# Patient Record
Sex: Male | Born: 1945 | Race: Black or African American | Hispanic: No | Marital: Married | Smoking: Former smoker
Health system: Southern US, Community
[De-identification: ages and names within clinical notes are randomized; demographics above are authoritative.]

## PROBLEM LIST (undated history)

## (undated) DIAGNOSIS — E785 Hyperlipidemia, unspecified: Secondary | ICD-10-CM

## (undated) DIAGNOSIS — K759 Inflammatory liver disease, unspecified: Secondary | ICD-10-CM

## (undated) DIAGNOSIS — I10 Essential (primary) hypertension: Secondary | ICD-10-CM

## (undated) DIAGNOSIS — M1990 Unspecified osteoarthritis, unspecified site: Secondary | ICD-10-CM

## (undated) DIAGNOSIS — R519 Headache, unspecified: Secondary | ICD-10-CM

## (undated) HISTORY — PX: OTHER SURGICAL HISTORY: SHX169

---

## 2015-09-20 HISTORY — PX: MULTIPLE TOOTH EXTRACTIONS: SHX2053

## 2018-05-22 DIAGNOSIS — E785 Hyperlipidemia, unspecified: Secondary | ICD-10-CM | POA: Diagnosis not present

## 2018-05-22 DIAGNOSIS — I10 Essential (primary) hypertension: Secondary | ICD-10-CM | POA: Diagnosis not present

## 2018-06-21 DIAGNOSIS — E559 Vitamin D deficiency, unspecified: Secondary | ICD-10-CM | POA: Diagnosis not present

## 2018-06-21 DIAGNOSIS — I10 Essential (primary) hypertension: Secondary | ICD-10-CM | POA: Diagnosis not present

## 2018-09-18 DIAGNOSIS — J069 Acute upper respiratory infection, unspecified: Secondary | ICD-10-CM | POA: Diagnosis not present

## 2018-12-06 DIAGNOSIS — I10 Essential (primary) hypertension: Secondary | ICD-10-CM | POA: Diagnosis not present

## 2018-12-06 DIAGNOSIS — E785 Hyperlipidemia, unspecified: Secondary | ICD-10-CM | POA: Diagnosis not present

## 2019-05-14 DIAGNOSIS — E349 Endocrine disorder, unspecified: Secondary | ICD-10-CM | POA: Diagnosis not present

## 2019-05-14 DIAGNOSIS — R972 Elevated prostate specific antigen [PSA]: Secondary | ICD-10-CM | POA: Diagnosis not present

## 2019-05-14 DIAGNOSIS — N5201 Erectile dysfunction due to arterial insufficiency: Secondary | ICD-10-CM | POA: Diagnosis not present

## 2019-06-03 DIAGNOSIS — N5201 Erectile dysfunction due to arterial insufficiency: Secondary | ICD-10-CM | POA: Diagnosis not present

## 2019-06-12 DIAGNOSIS — E559 Vitamin D deficiency, unspecified: Secondary | ICD-10-CM | POA: Diagnosis not present

## 2019-06-12 DIAGNOSIS — I10 Essential (primary) hypertension: Secondary | ICD-10-CM | POA: Diagnosis not present

## 2019-06-12 DIAGNOSIS — E785 Hyperlipidemia, unspecified: Secondary | ICD-10-CM | POA: Diagnosis not present

## 2019-08-20 DIAGNOSIS — I10 Essential (primary) hypertension: Secondary | ICD-10-CM | POA: Diagnosis not present

## 2019-08-20 DIAGNOSIS — Z136 Encounter for screening for cardiovascular disorders: Secondary | ICD-10-CM | POA: Diagnosis not present

## 2019-08-20 DIAGNOSIS — Z23 Encounter for immunization: Secondary | ICD-10-CM | POA: Diagnosis not present

## 2019-08-20 DIAGNOSIS — Z1211 Encounter for screening for malignant neoplasm of colon: Secondary | ICD-10-CM | POA: Diagnosis not present

## 2019-08-20 DIAGNOSIS — Z0000 Encounter for general adult medical examination without abnormal findings: Secondary | ICD-10-CM | POA: Diagnosis not present

## 2019-08-20 DIAGNOSIS — Z125 Encounter for screening for malignant neoplasm of prostate: Secondary | ICD-10-CM | POA: Diagnosis not present

## 2019-08-20 DIAGNOSIS — E7800 Pure hypercholesterolemia, unspecified: Secondary | ICD-10-CM | POA: Diagnosis not present

## 2019-10-15 DIAGNOSIS — R972 Elevated prostate specific antigen [PSA]: Secondary | ICD-10-CM | POA: Diagnosis not present

## 2019-10-15 DIAGNOSIS — N5201 Erectile dysfunction due to arterial insufficiency: Secondary | ICD-10-CM | POA: Diagnosis not present

## 2020-01-07 DIAGNOSIS — Z1211 Encounter for screening for malignant neoplasm of colon: Secondary | ICD-10-CM | POA: Diagnosis not present

## 2020-04-07 DIAGNOSIS — G629 Polyneuropathy, unspecified: Secondary | ICD-10-CM | POA: Diagnosis not present

## 2020-04-07 DIAGNOSIS — E559 Vitamin D deficiency, unspecified: Secondary | ICD-10-CM | POA: Diagnosis not present

## 2020-04-07 DIAGNOSIS — I10 Essential (primary) hypertension: Secondary | ICD-10-CM | POA: Diagnosis not present

## 2020-04-07 DIAGNOSIS — R972 Elevated prostate specific antigen [PSA]: Secondary | ICD-10-CM | POA: Diagnosis not present

## 2020-04-07 DIAGNOSIS — E785 Hyperlipidemia, unspecified: Secondary | ICD-10-CM | POA: Diagnosis not present

## 2020-04-08 DIAGNOSIS — R7309 Other abnormal glucose: Secondary | ICD-10-CM | POA: Diagnosis not present

## 2020-04-13 DIAGNOSIS — E7800 Pure hypercholesterolemia, unspecified: Secondary | ICD-10-CM | POA: Diagnosis not present

## 2020-04-13 DIAGNOSIS — I10 Essential (primary) hypertension: Secondary | ICD-10-CM | POA: Diagnosis not present

## 2020-04-13 DIAGNOSIS — E785 Hyperlipidemia, unspecified: Secondary | ICD-10-CM | POA: Diagnosis not present

## 2020-04-15 ENCOUNTER — Emergency Department (HOSPITAL_COMMUNITY): Payer: Medicare HMO

## 2020-04-15 ENCOUNTER — Encounter (HOSPITAL_COMMUNITY): Payer: Self-pay | Admitting: Pediatrics

## 2020-04-15 ENCOUNTER — Other Ambulatory Visit: Payer: Self-pay

## 2020-04-15 ENCOUNTER — Emergency Department (HOSPITAL_COMMUNITY)
Admission: EM | Admit: 2020-04-15 | Discharge: 2020-04-16 | Disposition: A | Discharge status: Home / Self Care | Payer: Medicare HMO | Attending: Emergency Medicine | Admitting: Emergency Medicine

## 2020-04-15 DIAGNOSIS — S82891A Other fracture of right lower leg, initial encounter for closed fracture: Secondary | ICD-10-CM

## 2020-04-15 DIAGNOSIS — M25571 Pain in right ankle and joints of right foot: Secondary | ICD-10-CM | POA: Diagnosis not present

## 2020-04-15 DIAGNOSIS — X30XXXA Exposure to excessive natural heat, initial encounter: Secondary | ICD-10-CM | POA: Diagnosis not present

## 2020-04-15 DIAGNOSIS — S82831A Other fracture of upper and lower end of right fibula, initial encounter for closed fracture: Secondary | ICD-10-CM | POA: Diagnosis not present

## 2020-04-15 DIAGNOSIS — Y939 Activity, unspecified: Secondary | ICD-10-CM | POA: Insufficient documentation

## 2020-04-15 DIAGNOSIS — R52 Pain, unspecified: Secondary | ICD-10-CM | POA: Diagnosis not present

## 2020-04-15 DIAGNOSIS — Y999 Unspecified external cause status: Secondary | ICD-10-CM | POA: Diagnosis not present

## 2020-04-15 DIAGNOSIS — Y929 Unspecified place or not applicable: Secondary | ICD-10-CM | POA: Diagnosis not present

## 2020-04-15 DIAGNOSIS — S99911A Unspecified injury of right ankle, initial encounter: Secondary | ICD-10-CM | POA: Diagnosis present

## 2020-04-15 DIAGNOSIS — R609 Edema, unspecified: Secondary | ICD-10-CM | POA: Diagnosis not present

## 2020-04-15 DIAGNOSIS — R55 Syncope and collapse: Secondary | ICD-10-CM | POA: Insufficient documentation

## 2020-04-15 DIAGNOSIS — R600 Localized edema: Secondary | ICD-10-CM | POA: Diagnosis not present

## 2020-04-15 DIAGNOSIS — J341 Cyst and mucocele of nose and nasal sinus: Secondary | ICD-10-CM | POA: Diagnosis not present

## 2020-04-15 DIAGNOSIS — T675XXA Heat exhaustion, unspecified, initial encounter: Secondary | ICD-10-CM

## 2020-04-15 DIAGNOSIS — S0990XA Unspecified injury of head, initial encounter: Secondary | ICD-10-CM | POA: Diagnosis not present

## 2020-04-15 DIAGNOSIS — R4020 Unspecified coma: Secondary | ICD-10-CM | POA: Diagnosis not present

## 2020-04-15 DIAGNOSIS — R42 Dizziness and giddiness: Secondary | ICD-10-CM | POA: Diagnosis not present

## 2020-04-15 HISTORY — DX: Other fracture of right lower leg, initial encounter for closed fracture: S82.891A

## 2020-04-15 LAB — BASIC METABOLIC PANEL
Anion gap: 9 (ref 5–15)
BUN: 10 mg/dL (ref 8–23)
CO2: 30 mmol/L (ref 22–32)
Calcium: 9.6 mg/dL (ref 8.9–10.3)
Chloride: 103 mmol/L (ref 98–111)
Creatinine, Ser: 1.46 mg/dL — ABNORMAL HIGH (ref 0.61–1.24)
GFR calc Af Amer: 54 mL/min — ABNORMAL LOW (ref >60)
GFR calc non Af Amer: 46 mL/min — ABNORMAL LOW (ref >60)
Glucose, Bld: 162 mg/dL — ABNORMAL HIGH (ref 70–99)
Potassium: 4.1 mmol/L (ref 3.5–5.1)
Sodium: 142 mmol/L (ref 135–145)

## 2020-04-15 LAB — CBC
HCT: 44.7 % (ref 39.0–52.0)
Hemoglobin: 14.4 g/dL (ref 13.0–17.0)
MCH: 30.5 pg (ref 26.0–34.0)
MCHC: 32.2 g/dL (ref 30.0–36.0)
MCV: 94.7 fL (ref 80.0–100.0)
Platelets: 209 10*3/uL (ref 150–400)
RBC: 4.72 MIL/uL (ref 4.22–5.81)
RDW: 12.8 % (ref 11.5–15.5)
WBC: 12.8 10*3/uL — ABNORMAL HIGH (ref 4.0–10.5)
nRBC: 0 % (ref 0.0–0.2)

## 2020-04-15 MED ORDER — SODIUM CHLORIDE 0.9 % IV BOLUS
1000.0000 mL | Freq: Once | INTRAVENOUS | Status: AC
  Administered 2020-04-15: 1000 mL via INTRAVENOUS

## 2020-04-15 MED ORDER — SODIUM CHLORIDE 0.9% FLUSH: 3.0000 mL | Freq: Once | INTRAVENOUS | Status: DC

## 2020-04-15 NOTE — ED Provider Notes (Signed)
[facility] EMERGENCY DEPARTMENT Provider Note   CSN: [id number] Arrival date & time: 04/15/20  1408     History Chief Complaint  Patient presents with  . Dizziness    Aaron Gregory is a 74 y.o. male.  Patient is a 74 year old male presenting with complaints of syncope.  Patient woke up this morning feeling well.  He then went outside and began mowing grass in the heat.  He reports taking frequent breaks during which time he would consume water.  He was walking back to the house to get a glass of water when he became lightheaded and dizzy.  He then leaned up against his car in the garage, then had a syncopal episode and ended up on the concrete floor.  He did injure his ankle on the way down.  This episode was witnessed by his neighbor and there was no reported seizure activity.  His loss of consciousness was brief and he regained consciousness without confusion.  EMS was contacted and the patient was transported here.  He was given IV fluid in route.  The history is provided by the patient.       History reviewed. No pertinent past medical history.  There are no problems to display for this patient.   History reviewed. No pertinent surgical history.     No family history on file.  Social History   Tobacco Use  . Smoking status: Not on file  Substance Use Topics  . Alcohol use: Not on file  . Drug use: Not on file    Home Medications Prior to Admission medications   Not on File    Allergies    Patient has no known allergies.  Review of Systems   Review of Systems  All other systems reviewed and are negative.   Physical Exam Updated Vital Signs BP (!) 154/109   Pulse 78   Temp 98.4 F (36.9 C) (Oral)   Resp 18   Ht 5\' 9"  (1.753 m)   Wt 86.2 kg   SpO2 98%   BMI 28.06 kg/m   Physical Exam Vitals and nursing note reviewed.  Constitutional:      General: He is not in acute distress.    Appearance: Normal appearance. He is  well-developed. He is not ill-appearing or diaphoretic.  HENT:     Head: Normocephalic and atraumatic.  Cardiovascular:     Rate and Rhythm: Normal rate and regular rhythm.     Heart sounds: No murmur heard.  No friction rub.  Pulmonary:     Effort: Pulmonary effort is normal. No respiratory distress.     Breath sounds: Normal breath sounds. No wheezing or rales.  Abdominal:     General: Bowel sounds are normal. There is no distension.     Palpations: Abdomen is soft.     Tenderness: There is no abdominal tenderness.  Musculoskeletal:        General: Normal range of motion.     Cervical back: Normal range of motion and neck supple.  Skin:    General: Skin is warm and dry.  Neurological:     General: No focal deficit present.     Mental Status: He is alert and oriented to person, place, and time.     Cranial Nerves: No cranial nerve deficit.     Motor: No weakness.     Coordination: Coordination normal.     ED Results / Procedures / Treatments   Labs (all labs ordered are listed, but  only abnormal results are displayed) Labs Reviewed  BASIC METABOLIC PANEL - Abnormal; Notable for the following components:      Result Value   Glucose, Bld 162 (*)    Creatinine, Ser 1.46 (*)    GFR calc non Af Amer 46 (*)    GFR calc Af Amer 54 (*)    All other components within normal limits  CBC - Abnormal; Notable for the following components:   WBC 12.8 (*)    All other components within normal limits  URINALYSIS, ROUTINE W REFLEX MICROSCOPIC  CBG MONITORING, ED    EKG EKG Interpretation  Date/Time:  Wednesday April 15 2020 14:36:25 EDT Ventricular Rate:  85 PR Interval:  196 QRS Duration: 90 QT Interval:  368 QTC Calculation: 437 R Axis:   -28 Text Interpretation: Normal sinus rhythm Possible Left atrial enlargement Borderline ECG Confirmed by Geoffery Lyons ([id number]) on 04/15/2020 10:31:32 PM   Radiology DG Ankle 2 Views Right  Result Date: 04/15/2020 CLINICAL DATA:  Right  ankle pain after fall. Pain and swelling after syncopal episode this morning. EXAM: RIGHT ANKLE - 2 VIEW COMPARISON:  None. FINDINGS: Oblique mildly displaced distal fibular fracture just proximal to the ankle mortise. Possible small avulsion fracture from the medial malleolus which is likely acute. Equivocal widening of the medial clear space of 5 mm. Generalized soft tissue edema with possible joint effusion. IMPRESSION: 1. Oblique mildly displaced distal fibular fracture just proximal to the ankle mortise. Possible small avulsion fracture from the medial malleolus. 2. Equivocal widening of the medial clear space of 5 mm. Electronically Signed   By: Narda Rutherford M.D.   On: 04/15/2020 15:25   CT HEAD WO CONTRAST  Result Date: 04/15/2020 CLINICAL DATA:  Syncope, fall EXAM: CT HEAD WITHOUT CONTRAST TECHNIQUE: Contiguous axial images were obtained from the base of the skull through the vertex without intravenous contrast. COMPARISON:  None. FINDINGS: Brain: No evidence of acute infarction, hemorrhage, hydrocephalus, extra-axial collection or mass lesion/mass effect. Vascular: Negative for hyperdense vessel Skull: Negative Sinuses/Orbits: Cyst in the right maxillary sinus otherwise clear sinuses. Negative orbit Other: None IMPRESSION: Negative CT head Electronically Signed   By: Marlan Palau M.D.   On: 04/15/2020 17:09    Procedures Procedures (including critical care time)  Medications Ordered in ED Medications  sodium chloride flush (NS) 0.9 % injection 3 mL (has no administration in time range)  sodium chloride 0.9 % bolus 1,000 mL (has no administration in time range)    ED Course  I have reviewed the triage vital signs and the nursing notes.  Pertinent labs & imaging results that were available during my care of the patient were reviewed by me and considered in my medical decision making (see chart for details).    MDM Rules/Calculators/A&P  Patient is a 74 year old male presenting  after a syncopal episode as described in the HPI.  Patient's work-up shows no abnormality of his CT and laboratory studies are essentially unremarkable.  His EKG shows sinus rhythm with no acute changes.  Patient has remained stable throughout his emergency department course.  He has been observed for many hours and I feel as though discharge is appropriate.  I suspect that his syncopal episode was vasovagal in nature and likely related to working in the yard in the extreme heat and humidity.  Patient will be given normal saline here.  He also has a fibular fracture on his x-ray.  This will be splinted and patient to follow-up with orthopedics.  Final Clinical Impression(s) / ED Diagnoses Final diagnoses:  None    Rx / DC Orders ED Discharge Orders    None       Geoffery Lyons, MD 04/15/20 2235

## 2020-04-15 NOTE — Discharge Instructions (Addendum)
Drink plenty of fluids and get plenty of rest.  Return to the emergency department if you develop chest pain, difficulty breathing, recurrent passing out spells, or other new and concerning symptoms.  You are to follow-up with orthopedics in the next week.  The contact information for Dr. Eulah Pont has been provided in this discharge summary for you to call and make these arrangements.  In the meantime, you should not bear weight on your ankle.  Keep it elevated as much as possible.  Ice for 20 minutes every 2 hours while awake for the next 2 days.

## 2020-04-15 NOTE — ED Notes (Signed)
Na x 2.

## 2020-04-15 NOTE — Progress Notes (Signed)
Orthopedic Tech Progress Note Patient Details:  Aaron Gregory 12-Jan-1946 [id number]  Ortho Devices Type of Ortho Device: Crutches, Post (short leg) splint, Stirrup splint Ortho Device/Splint Location: RLE Ortho Device/Splint Interventions: Application, Adjustment   Post Interventions Patient Tolerated: Well Instructions Provided: Adjustment of device, Care of device, Poper ambulation with device   [name] [name] 04/15/2020, 11:01 PM

## 2020-04-15 NOTE — ED Notes (Signed)
Ortho at bedside applying a splint

## 2020-04-15 NOTE — ED Triage Notes (Signed)
Arrived via EMS; reported syncope after doing yard work. EMS endorsed initial BP was normal, but standing up, BP is low.

## 2020-04-15 NOTE — ED Notes (Signed)
edp saw before myself the pt came in by ambulance iv by ems rt ankjle pain

## 2020-04-20 DIAGNOSIS — S82424A Nondisplaced transverse fracture of shaft of right fibula, initial encounter for closed fracture: Secondary | ICD-10-CM | POA: Diagnosis not present

## 2020-04-22 ENCOUNTER — Other Ambulatory Visit: Payer: Self-pay

## 2020-04-22 ENCOUNTER — Encounter (HOSPITAL_BASED_OUTPATIENT_CLINIC_OR_DEPARTMENT_OTHER): Payer: Self-pay | Admitting: Orthopedic Surgery

## 2020-04-22 NOTE — Progress Notes (Signed)
Spoke w/ via phone for pre-op interview---PT Lab needs dos---- none             Lab results------ Cbc, bmet and ekg done 04-15-2020 epic              COVID test ------04-25-2020 at 1100 am Arrive at -------1200 pm 04-28-2020 NPO after MN NO Solid Food.  Clear liquids from MN until---950 am then npo Medications to take morning of surgery -----amlodipine, atorvastatin Diabetic medication -----n/a Patient Special Instructions -----none Pre-Op special Istructions -----none Patient verbalized understanding of instructions that were given at this phone interview. Patient denies shortness of breath, chest pain, fever, cough at this phone interview.

## 2020-04-22 NOTE — H&P (Signed)
74yom with c/o of right ankle pain. Pt. States that he had a syncope episode 7/28 and fainted. He injured his right ankle on the way down. Pt. Went to the ED for evaluation w/u reveled a right ankle fx. He come to the office for further evaluation and d/w him the options which included ORIF.   Past medical history: No pertinent past medical history.  Surgical history: No pertinent surgical history  Allergies: NKDA  Medications: OTC analgesics   Social history: Denies smoking, IDU, EtOH  Family history: Non-contributory   PE Physical Exam Vitals and nursing note reviewed.  Constitutional:      General: He is not in acute distress.    Appearance: Normal appearance. He is well-developed. He is not ill-appearing or diaphoretic.  HENT:     Head: Normocephalic and atraumatic.  Cardiovascular:     Rate and Rhythm: Normal rate and regular rhythm.     Heart sounds: No murmur heard.  No friction rub.  Pulmonary:     Effort: Pulmonary effort is normal. No respiratory distress.     Breath sounds: Normal breath sounds. No wheezing or rales.  Abdominal:     General: Bowel sounds are normal. There is no distension.     Palpations: Abdomen is soft.     Tenderness: There is no abdominal tenderness.  Musculoskeletal:        RLE in a splint, NVI  General: Normal range of motion.     Cervical back: Normal range of motion and neck supple.  Skin:    General: Skin is warm and dry.  Neurological:     General: No focal deficit present.     Mental Status: He is alert and oriented to person, place, and time.     Cranial Nerves: No cranial nerve deficit.     Motor: No weakness.     Coordination: Coordination normal.    Imaging 04/15/20 IMPRESSION: 1. Oblique mildly displaced distal fibular fracture just proximal to the ankle mortise. Possible small avulsion fracture from the medial malleolus. 2. Equivocal widening of the medial clear space of 5 mm.   A/P   74yom with right ankle  f/x After discussing the risk/benifits/alternative of ORIF of the right ankle he has elected to proceed with the surgery.

## 2020-04-25 ENCOUNTER — Other Ambulatory Visit (HOSPITAL_COMMUNITY)
Admission: RE | Admit: 2020-04-25 | Discharge: 2020-04-25 | Disposition: A | Discharge status: Home / Self Care | Payer: Medicare HMO | Source: Ambulatory Visit | Attending: Orthopedic Surgery | Admitting: Orthopedic Surgery

## 2020-04-25 DIAGNOSIS — Z20822 Contact with and (suspected) exposure to covid-19: Secondary | ICD-10-CM | POA: Insufficient documentation

## 2020-04-25 DIAGNOSIS — Z01812 Encounter for preprocedural laboratory examination: Secondary | ICD-10-CM | POA: Diagnosis not present

## 2020-04-25 LAB — SARS CORONAVIRUS 2 (TAT 6-24 HRS): SARS Coronavirus 2: NEGATIVE

## 2020-04-28 ENCOUNTER — Encounter (HOSPITAL_BASED_OUTPATIENT_CLINIC_OR_DEPARTMENT_OTHER)
Admission: RE | Disposition: A | Discharge status: Home / Self Care | Payer: Self-pay | Source: Home / Self Care | Attending: Orthopedic Surgery

## 2020-04-28 ENCOUNTER — Ambulatory Visit (HOSPITAL_BASED_OUTPATIENT_CLINIC_OR_DEPARTMENT_OTHER): Payer: Medicare HMO | Admitting: Certified Registered"

## 2020-04-28 ENCOUNTER — Ambulatory Visit (HOSPITAL_BASED_OUTPATIENT_CLINIC_OR_DEPARTMENT_OTHER)
Admission: RE | Admit: 2020-04-28 | Discharge: 2020-04-28 | Disposition: A | Discharge status: Home / Self Care | Payer: Medicare HMO | Attending: Orthopedic Surgery | Admitting: Orthopedic Surgery

## 2020-04-28 ENCOUNTER — Encounter (HOSPITAL_BASED_OUTPATIENT_CLINIC_OR_DEPARTMENT_OTHER): Payer: Self-pay | Admitting: Orthopedic Surgery

## 2020-04-28 ENCOUNTER — Other Ambulatory Visit: Payer: Self-pay

## 2020-04-28 DIAGNOSIS — M25371 Other instability, right ankle: Secondary | ICD-10-CM | POA: Insufficient documentation

## 2020-04-28 DIAGNOSIS — S82891A Other fracture of right lower leg, initial encounter for closed fracture: Secondary | ICD-10-CM | POA: Diagnosis not present

## 2020-04-28 DIAGNOSIS — I10 Essential (primary) hypertension: Secondary | ICD-10-CM | POA: Insufficient documentation

## 2020-04-28 DIAGNOSIS — W19XXXA Unspecified fall, initial encounter: Secondary | ICD-10-CM | POA: Insufficient documentation

## 2020-04-28 DIAGNOSIS — G8918 Other acute postprocedural pain: Secondary | ICD-10-CM | POA: Diagnosis not present

## 2020-04-28 DIAGNOSIS — Z87891 Personal history of nicotine dependence: Secondary | ICD-10-CM | POA: Insufficient documentation

## 2020-04-28 DIAGNOSIS — S82831A Other fracture of upper and lower end of right fibula, initial encounter for closed fracture: Secondary | ICD-10-CM | POA: Diagnosis not present

## 2020-04-28 DIAGNOSIS — E785 Hyperlipidemia, unspecified: Secondary | ICD-10-CM | POA: Diagnosis not present

## 2020-04-28 DIAGNOSIS — S82841A Displaced bimalleolar fracture of right lower leg, initial encounter for closed fracture: Secondary | ICD-10-CM | POA: Diagnosis not present

## 2020-04-28 HISTORY — DX: Essential (primary) hypertension: I10

## 2020-04-28 HISTORY — DX: Hyperlipidemia, unspecified: E78.5

## 2020-04-28 HISTORY — DX: Inflammatory liver disease, unspecified: K75.9

## 2020-04-28 HISTORY — DX: Headache, unspecified: R51.9

## 2020-04-28 HISTORY — DX: Unspecified osteoarthritis, unspecified site: M19.90

## 2020-04-28 HISTORY — PX: ORIF ANKLE FRACTURE: SHX5408

## 2020-04-28 SURGERY — OPEN REDUCTION INTERNAL FIXATION (ORIF) ANKLE FRACTURE
Anesthesia: General | Site: Ankle | Laterality: Right

## 2020-04-28 MED ORDER — ACETAMINOPHEN 500 MG PO TABS
ORAL_TABLET | ORAL | Status: AC
  Filled 2020-04-28: qty 2

## 2020-04-28 MED ORDER — PROMETHAZINE HCL 25 MG/ML IJ SOLN: 6.2500 mg | INTRAMUSCULAR | Status: DC | PRN

## 2020-04-28 MED ORDER — MIDAZOLAM HCL 2 MG/2ML IJ SOLN
2.0000 mg | Freq: Once | INTRAMUSCULAR | Status: AC
  Administered 2020-04-28: 2 mg via INTRAVENOUS

## 2020-04-28 MED ORDER — CEFAZOLIN SODIUM-DEXTROSE 2-4 GM/100ML-% IV SOLN
2.0000 g | INTRAVENOUS | Status: AC
  Administered 2020-04-28: 2 g via INTRAVENOUS

## 2020-04-28 MED ORDER — HYDROCODONE-ACETAMINOPHEN 5-325 MG PO TABS: 1.0000 | ORAL_TABLET | Freq: Four times a day (QID) | ORAL | 0 refills | Status: AC | PRN

## 2020-04-28 MED ORDER — CEFAZOLIN SODIUM-DEXTROSE 2-4 GM/100ML-% IV SOLN: 2.0000 g | INTRAVENOUS | Status: DC

## 2020-04-28 MED ORDER — MIDAZOLAM HCL 2 MG/2ML IJ SOLN
INTRAMUSCULAR | Status: AC
  Filled 2020-04-28: qty 2

## 2020-04-28 MED ORDER — CEFAZOLIN SODIUM-DEXTROSE 2-4 GM/100ML-% IV SOLN
INTRAVENOUS | Status: AC
  Filled 2020-04-28: qty 100

## 2020-04-28 MED ORDER — BUPIVACAINE-EPINEPHRINE (PF) 0.5% -1:200000 IJ SOLN
INTRAMUSCULAR | Status: DC | PRN
Start: 2020-04-28 — End: 2020-04-28
  Administered 2020-04-28: 10 mL via PERINEURAL
  Administered 2020-04-28: 20 mL via PERINEURAL

## 2020-04-28 MED ORDER — FENTANYL CITRATE (PF) 100 MCG/2ML IJ SOLN
INTRAMUSCULAR | Status: DC | PRN
  Administered 2020-04-28 (×2): 50 ug via INTRAVENOUS

## 2020-04-28 MED ORDER — ONDANSETRON HCL 4 MG/2ML IJ SOLN
INTRAMUSCULAR | Status: AC
  Filled 2020-04-28: qty 2

## 2020-04-28 MED ORDER — PHENYLEPHRINE 40 MCG/ML (10ML) SYRINGE FOR IV PUSH (FOR BLOOD PRESSURE SUPPORT)
PREFILLED_SYRINGE | INTRAVENOUS | Status: DC | PRN
  Administered 2020-04-28 (×5): 80 ug via INTRAVENOUS

## 2020-04-28 MED ORDER — ONDANSETRON HCL 4 MG/2ML IJ SOLN
INTRAMUSCULAR | Status: DC | PRN
  Administered 2020-04-28: 4 mg via INTRAVENOUS

## 2020-04-28 MED ORDER — LIDOCAINE 2% (20 MG/ML) 5 ML SYRINGE
INTRAMUSCULAR | Status: AC
  Filled 2020-04-28: qty 5

## 2020-04-28 MED ORDER — EPHEDRINE SULFATE-NACL 50-0.9 MG/10ML-% IV SOSY
PREFILLED_SYRINGE | INTRAVENOUS | Status: DC | PRN
  Administered 2020-04-28: 10 mg via INTRAVENOUS

## 2020-04-28 MED ORDER — OXYCODONE HCL 5 MG PO TABS: 5.0000 mg | ORAL_TABLET | Freq: Once | ORAL | Status: DC | PRN

## 2020-04-28 MED ORDER — ASPIRIN EC 81 MG PO TBEC
81.0000 mg | DELAYED_RELEASE_TABLET | Freq: Every day | ORAL | 2 refills | Status: AC
Start: 2020-04-28 — End: 2021-04-28

## 2020-04-28 MED ORDER — LACTATED RINGERS IV BOLUS: 250.0000 mL | Freq: Once | INTRAVENOUS | Status: DC

## 2020-04-28 MED ORDER — PROPOFOL 10 MG/ML IV BOLUS
INTRAVENOUS | Status: DC | PRN
  Administered 2020-04-28: 180 mg via INTRAVENOUS

## 2020-04-28 MED ORDER — FENTANYL CITRATE (PF) 100 MCG/2ML IJ SOLN
INTRAMUSCULAR | Status: AC
  Filled 2020-04-28: qty 2

## 2020-04-28 MED ORDER — ACETAMINOPHEN 500 MG PO TABS
1000.0000 mg | ORAL_TABLET | Freq: Once | ORAL | Status: AC
  Administered 2020-04-28: 1000 mg via ORAL

## 2020-04-28 MED ORDER — FENTANYL CITRATE (PF) 100 MCG/2ML IJ SOLN: 25.0000 ug | INTRAMUSCULAR | Status: DC | PRN

## 2020-04-28 MED ORDER — FENTANYL CITRATE (PF) 100 MCG/2ML IJ SOLN
100.0000 ug | Freq: Once | INTRAMUSCULAR | Status: AC
  Administered 2020-04-28: 100 ug via INTRAVENOUS

## 2020-04-28 MED ORDER — PROPOFOL 10 MG/ML IV BOLUS
INTRAVENOUS | Status: AC
  Filled 2020-04-28: qty 40

## 2020-04-28 MED ORDER — CLONIDINE HCL (ANALGESIA) 100 MCG/ML EP SOLN
EPIDURAL | Status: DC | PRN
Start: 2020-04-28 — End: 2020-04-28
  Administered 2020-04-28: 67 ug
  Administered 2020-04-28: 33 ug

## 2020-04-28 MED ORDER — OXYCODONE HCL 5 MG/5ML PO SOLN: 5.0000 mg | Freq: Once | ORAL | Status: DC | PRN

## 2020-04-28 MED ORDER — DEXAMETHASONE SODIUM PHOSPHATE 4 MG/ML IJ SOLN
INTRAMUSCULAR | Status: DC | PRN
  Administered 2020-04-28: 4 mg via INTRAVENOUS

## 2020-04-28 MED ORDER — LIDOCAINE 2% (20 MG/ML) 5 ML SYRINGE
INTRAMUSCULAR | Status: DC | PRN
  Administered 2020-04-28: 80 mg via INTRAVENOUS

## 2020-04-28 MED ORDER — LACTATED RINGERS IV SOLN
INTRAVENOUS | Status: DC
  Administered 2020-04-28: 1000 mL via INTRAVENOUS

## 2020-04-28 MED ORDER — LACTATED RINGERS IV BOLUS: 500.0000 mL | Freq: Once | INTRAVENOUS | Status: DC

## 2020-04-28 SURGICAL SUPPLY — 83 items
APL PRP STRL LF DISP 70% ISPRP (MISCELLANEOUS) ×1
BANDAGE ESMARK 6X9 LF (GAUZE/BANDAGES/DRESSINGS) ×1 IMPLANT
BIT DRILL 3.5X122MM AO FIT (BIT) ×3 IMPLANT
BLADE SURG 15 STRL LF DISP TIS (BLADE) ×2 IMPLANT
BLADE SURG 15 STRL SS (BLADE) ×6
BNDG CMPR 9X6 STRL LF SNTH (GAUZE/BANDAGES/DRESSINGS) ×1
BNDG COHESIVE 4X5 TAN STRL (GAUZE/BANDAGES/DRESSINGS) ×3 IMPLANT
BNDG ELASTIC 4X5.8 VLCR STR LF (GAUZE/BANDAGES/DRESSINGS) ×3 IMPLANT
BNDG ELASTIC 6X5.8 VLCR STR LF (GAUZE/BANDAGES/DRESSINGS) ×9 IMPLANT
BNDG ESMARK 6X9 LF (GAUZE/BANDAGES/DRESSINGS) ×3
BNDG GAUZE ELAST 4 BULKY (GAUZE/BANDAGES/DRESSINGS) ×3 IMPLANT
CHLORAPREP W/TINT 26 (MISCELLANEOUS) ×3 IMPLANT
CLOSURE STERI-STRIP 1/2X4 (GAUZE/BANDAGES/DRESSINGS) ×1
CLSR STERI-STRIP ANTIMIC 1/2X4 (GAUZE/BANDAGES/DRESSINGS) ×2 IMPLANT
COUNTERSINK (MISCELLANEOUS) ×3
COVER BACK TABLE 60X90IN (DRAPES) ×3 IMPLANT
COVER MAYO STAND STRL (DRAPES) ×3 IMPLANT
COVER WAND RF STERILE (DRAPES) ×3 IMPLANT
CUFF TOURN SGL QUICK 24 (TOURNIQUET CUFF)
CUFF TOURN SGL QUICK 34 (TOURNIQUET CUFF)
CUFF TRNQT CYL 24X4X16.5-23 (TOURNIQUET CUFF) IMPLANT
CUFF TRNQT CYL 34X4.125X (TOURNIQUET CUFF) IMPLANT
DECANTER SPIKE VIAL GLASS SM (MISCELLANEOUS) IMPLANT
DRAPE EXTREMITY T 121X128X90 (DISPOSABLE) ×3 IMPLANT
DRAPE IMP U-DRAPE 54X76 (DRAPES) ×3 IMPLANT
DRAPE OEC MINIVIEW 54X84 (DRAPES) ×3 IMPLANT
DRAPE U-SHAPE 47X51 STRL (DRAPES) ×3 IMPLANT
DRILL 2.6X122MM WL AO SHAFT (BIT) ×3 IMPLANT
DRSG ADAPTIC 3X8 NADH LF (GAUZE/BANDAGES/DRESSINGS) ×3 IMPLANT
DRSG EMULSION OIL 3X3 NADH (GAUZE/BANDAGES/DRESSINGS) ×6 IMPLANT
DRSG PAD ABDOMINAL 8X10 ST (GAUZE/BANDAGES/DRESSINGS) ×3 IMPLANT
ELECT REM PT RETURN 9FT ADLT (ELECTROSURGICAL) ×3
ELECTRODE REM PT RTRN 9FT ADLT (ELECTROSURGICAL) ×1 IMPLANT
GAUZE SPONGE 4X4 12PLY STRL (GAUZE/BANDAGES/DRESSINGS) ×3 IMPLANT
GAUZE XEROFORM 1X8 LF (GAUZE/BANDAGES/DRESSINGS) ×3 IMPLANT
GLOVE BIO SURGEON STRL SZ7.5 (GLOVE) ×6 IMPLANT
GLOVE BIOGEL PI IND STRL 8 (GLOVE) ×2 IMPLANT
GLOVE BIOGEL PI INDICATOR 8 (GLOVE) ×4
GOWN STRL REUS W/ TWL LRG LVL3 (GOWN DISPOSABLE) ×2 IMPLANT
GOWN STRL REUS W/ TWL XL LVL3 (GOWN DISPOSABLE) ×1 IMPLANT
GOWN STRL REUS W/TWL LRG LVL3 (GOWN DISPOSABLE) ×6
GOWN STRL REUS W/TWL XL LVL3 (GOWN DISPOSABLE) ×3
IMPL TIGHTROP W/DRV K-LESS (Anchor) ×1 IMPLANT
IMPLANT TIGHTROPE W/DRV K-LESS (Anchor) ×3 IMPLANT
NEEDLE HYPO 22GX1.5 SAFETY (NEEDLE) IMPLANT
NS IRRIG 1000ML POUR BTL (IV SOLUTION) ×3 IMPLANT
PACK BASIN DAY SURGERY FS (CUSTOM PROCEDURE TRAY) ×3 IMPLANT
PAD CAST 3X4 CTTN HI CHSV (CAST SUPPLIES) ×1 IMPLANT
PAD CAST 4YDX4 CTTN HI CHSV (CAST SUPPLIES) ×2 IMPLANT
PADDING CAST ABS 4INX4YD NS (CAST SUPPLIES) ×4
PADDING CAST ABS COTTON 4X4 ST (CAST SUPPLIES) ×2 IMPLANT
PADDING CAST COTTON 3X4 STRL (CAST SUPPLIES) ×3
PADDING CAST COTTON 4X4 STRL (CAST SUPPLIES) ×6
PADDING CAST COTTON 6X4 STRL (CAST SUPPLIES) ×3 IMPLANT
PENCIL SMOKE EVACUATOR (MISCELLANEOUS) ×3 IMPLANT
PLATE 1/3 TUBULAR 7H (Plate) ×3 IMPLANT
SCREW CANC FT 16X4X2.5XHEX (Screw) ×1 IMPLANT
SCREW CANCELLOUS 4.0X14 (Screw) ×3 IMPLANT
SCREW CANCELLOUS 4.0X16MM (Screw) ×3 IMPLANT
SCREW CORTEX ST MATTA 3.5X12MM (Screw) ×3 IMPLANT
SCREW CORTEX ST MATTA 3.5X14 (Screw) ×6 IMPLANT
SCREW CORTEX ST MATTA 3.5X20 (Screw) ×3 IMPLANT
SCREW COUNTERSINK (MISCELLANEOUS) ×1 IMPLANT
SCREW LOCK 4.0X10 CANCELLOUS (Screw) ×3 IMPLANT
SPLINT FAST PLASTER 5X30 (CAST SUPPLIES) ×2
SPLINT PLASTER CAST FAST 5X30 (CAST SUPPLIES) ×1 IMPLANT
SPONGE LAP 4X18 RFD (DISPOSABLE) ×3 IMPLANT
SUCTION FRAZIER HANDLE 10FR (MISCELLANEOUS) ×2
SUCTION TUBE FRAZIER 10FR DISP (MISCELLANEOUS) ×1 IMPLANT
SUT ETHILON 3 0 PS 1 (SUTURE) ×6 IMPLANT
SUT MNCRL AB 4-0 PS2 18 (SUTURE) IMPLANT
SUT MON AB 2-0 CT1 36 (SUTURE) IMPLANT
SUT MON AB 3-0 SH 27 (SUTURE)
SUT MON AB 3-0 SH27 (SUTURE) IMPLANT
SUT VIC AB 0 CT1 36 (SUTURE) ×3 IMPLANT
SUT VIC AB 2-0 SH 27 (SUTURE) ×6
SUT VIC AB 2-0 SH 27XBRD (SUTURE) ×2 IMPLANT
SYR BULB EAR ULCER 3OZ GRN STR (SYRINGE) ×3 IMPLANT
SYR CONTROL 10ML LL (SYRINGE) IMPLANT
TOWEL OR 17X26 10 PK STRL BLUE (TOWEL DISPOSABLE) ×6 IMPLANT
TUBE CONNECTING 12'X1/4 (SUCTIONS) ×1
TUBE CONNECTING 12X1/4 (SUCTIONS) ×2 IMPLANT
UNDERPAD 30X36 HEAVY ABSORB (UNDERPADS AND DIAPERS) ×3 IMPLANT

## 2020-04-28 NOTE — Progress Notes (Signed)
Assisted Dr. Howze with right, ultrasound guided, popliteal, adductor canal block. Side rails up, monitors on throughout procedure. See vital signs in flow sheet. Tolerated Procedure well. 

## 2020-04-28 NOTE — Transfer of Care (Signed)
Immediate Anesthesia Transfer of Care Note  Patient: Aaron Gregory  Procedure(s) Performed: OPEN REDUCTION INTERNAL FIXATION (ORIF) ANKLE FRACTURE (Right Ankle)  Patient Location: PACU  Anesthesia Type:General  Level of Consciousness: awake, alert , oriented and patient cooperative  Airway & Oxygen Therapy: Patient Spontanous Breathing and Patient connected to face mask oxygen  Post-op Assessment: Report given to RN, Post -op Vital signs reviewed and stable and Patient moving all extremities  Post vital signs: Reviewed and stable  Last Vitals:  Vitals Value Taken Time  BP 132/74 04/28/20 1615  Temp 36.7 C 04/28/20 1612  Pulse 74 04/28/20 1619  Resp 11 04/28/20 1619  SpO2 100 % 04/28/20 1619  Vitals shown include unvalidated device data.  Last Pain:  Vitals:   04/28/20 1612  TempSrc:   PainSc: Asleep         Complications: No complications documented.

## 2020-04-28 NOTE — Anesthesia Procedure Notes (Signed)
Anesthesia Regional Block: Adductor canal block   Pre-Anesthetic Checklist: ,, timeout performed, Correct Patient, Correct Site, Correct Laterality, Correct Procedure, Correct Position, site marked, Risks and benefits discussed, pre-op evaluation,  At surgeon's request and post-op pain management  Laterality: Right  Prep: Maximum Sterile Barrier Precautions used, chloraprep       Needles:  Injection technique: Single-shot  Needle Type: Echogenic Stimulator Needle     Needle Length: 9cm  Needle Gauge: 22     Additional Needles:   Procedures:,,,, ultrasound used (permanent image in chart),,,,  Narrative:  Start time: 04/28/2020 1:12 PM End time: 04/28/2020 1:15 PM Injection made incrementally with aspirations every 5 mL.  Performed by: Personally  Anesthesiologist: Kaylyn Layer, MD  Additional Notes: Risks, benefits, and alternative discussed. Patient gave consent for procedure. Patient prepped and draped in sterile fashion. Sedation administered, patient remains easily responsive to voice. Relevant anatomy identified with ultrasound guidance. Local anesthetic given in 5cc increments with no signs or symptoms of intravascular injection. No pain or paraesthesias with injection. Patient monitored throughout procedure with signs of LAST or immediate complications. Tolerated well. Ultrasound image placed in chart.  Amalia Greenhouse, MD

## 2020-04-28 NOTE — Progress Notes (Signed)
Upon removing splint there was  A  Zero foam pad covering two area one of which was fluid filled

## 2020-04-28 NOTE — Anesthesia Procedure Notes (Signed)
Anesthesia Regional Block: Popliteal block   Pre-Anesthetic Checklist: ,, timeout performed, Correct Patient, Correct Site, Correct Laterality, Correct Procedure, Correct Position, site marked, Risks and benefits discussed, pre-op evaluation,  At surgeon's request and post-op pain management  Laterality: Right  Prep: Maximum Sterile Barrier Precautions used, chloraprep       Needles:  Injection technique: Single-shot  Needle Type: Echogenic Stimulator Needle     Needle Length: 9cm  Needle Gauge: 22     Additional Needles:   Procedures:,,,, ultrasound used (permanent image in chart),,,,  Narrative:  Start time: 04/28/2020 1:15 PM End time: 04/28/2020 1:18 PM Injection made incrementally with aspirations every 5 mL.  Performed by: Personally  Anesthesiologist: Kaylyn Layer, MD  Additional Notes: Risks, benefits, and alternative discussed. Patient gave consent for procedure. Patient prepped and draped in sterile fashion. Sedation administered, patient remains easily responsive to voice. Relevant anatomy identified with ultrasound guidance. Local anesthetic given in 5cc increments with no signs or symptoms of intravascular injection. No pain or paraesthesias with injection. Patient monitored throughout procedure with signs of LAST or immediate complications. Tolerated well. Ultrasound image placed in chart.  Amalia Greenhouse, MD

## 2020-04-28 NOTE — Interval H&P Note (Signed)
History and Physical Interval Note:  04/28/2020 2:19 PM  Aaron Gregory  has presented today for surgery, with the diagnosis of RIGHT ANKLE FRACTURE.  The various methods of treatment have been discussed with the patient and family. After consideration of risks, benefits and other options for treatment, the patient has consented to  Procedure(s): OPEN REDUCTION INTERNAL FIXATION (ORIF) ANKLE FRACTURE (Right) as a surgical intervention.  The patient's history has been reviewed, patient examined, no change in status, stable for surgery.  I have reviewed the patient's chart and labs.  Questions were answered to the patient's satisfaction.     Sheral Apley

## 2020-04-28 NOTE — Anesthesia Postprocedure Evaluation (Signed)
Anesthesia Post Note  Patient: Aaron Gregory  Procedure(s) Performed: OPEN REDUCTION INTERNAL FIXATION (ORIF) ANKLE FRACTURE (Right Ankle)     Patient location during evaluation: PACU Anesthesia Type: General Level of consciousness: awake and alert Pain management: pain level controlled Vital Signs Assessment: post-procedure vital signs reviewed and stable Respiratory status: spontaneous breathing, nonlabored ventilation, respiratory function stable and patient connected to nasal cannula oxygen Cardiovascular status: blood pressure returned to baseline and stable Postop Assessment: no apparent nausea or vomiting Anesthetic complications: no   No complications documented.  Last Vitals:  Vitals:   04/28/20 1615 04/28/20 1630  BP: 132/74 118/77  Pulse: 76 73  Resp: 12 11  Temp:    SpO2: 100% 99%    Last Pain:  Vitals:   04/28/20 1630  TempSrc:   PainSc: 0-No pain                 Karrine Kluttz DAVID

## 2020-04-28 NOTE — Anesthesia Preprocedure Evaluation (Addendum)
Anesthesia Evaluation  Patient identified by MRN, date of birth, ID band Patient awake    Reviewed: Allergy & Precautions, NPO status , Patient's Chart, lab work & pertinent test results  History of Anesthesia Complications Negative for: history of anesthetic complications  Airway Mallampati: II  TM Distance: >3 FB Neck ROM: Full    Dental  (+) Edentulous Lower, Edentulous Upper   Pulmonary former smoker,    Pulmonary exam normal        Cardiovascular hypertension, Pt. 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  Reproductive/Obstetrics negative OB ROS                            Anesthesia Physical Anesthesia Plan  ASA: II  Anesthesia Plan: General   Post-op Pain Management: GA combined w/ Regional for post-op pain   Induction: Intravenous  PONV Risk Score and Plan: 2 and Treatment may vary due to age or medical condition, Ondansetron and Dexamethasone  Airway Management Planned: LMA  Additional Equipment: None  Intra-op Plan:   Post-operative Plan: Extubation in OR  Informed Consent: I have reviewed the patients History and Physical, chart, labs and discussed the procedure including the risks, benefits and alternatives for the proposed anesthesia with the patient or authorized representative who has indicated his/her understanding and acceptance.     Dental advisory given  Plan Discussed with: CRNA  Anesthesia Plan Comments:        Anesthesia Quick Evaluation

## 2020-04-28 NOTE — Op Note (Signed)
04/28/2020  3:25 PM  PATIENT:  [name]    PRE-OPERATIVE DIAGNOSIS:  RIGHT ANKLE FRACTURE  POST-OPERATIVE DIAGNOSIS:  Same  PROCEDURE:  OPEN REDUCTION INTERNAL FIXATION (ORIF) ANKLE FRACTURE  SURGEON:  Sheral Apley, MD  ASSISTANT: Daun Peacock, PA-C, he was present and scrubbed throughout the case, critical for completion in a timely fashion, and for retraction, instrumentation, and closure.   ANESTHESIA:   gen   PREOPERATIVE INDICATIONS:  Aaron Gregory is a  74 y.o. male with a diagnosis of RIGHT ANKLE FRACTURE who failed conservative measures and elected for surgical management.    The risks benefits and alternatives were discussed with the patient preoperatively including but not limited to the risks of infection, bleeding, nerve injury, cardiopulmonary complications, the need for revision surgery, among others, and the patient was willing to proceed.  OPERATIVE IMPLANTS: stryker plate and tightrope  OPERATIVE FINDINGS: Unstable ankle fracture. Stable syndesmosis post op  BLOOD LOSS: min  COMPLICATIONS: none  TOURNIQUET TIME:  OPERATIVE PROCEDURE:  Patient was identified in the preoperative holding area and site was marked by me He was transported to the operating theater and placed on the table in supine position taking care to pad all bony prominences. After a preincinduction time out anesthesia was induced. The right lower extremity was prepped and draped in normal sterile fashion and a pre-incision timeout was performed. Stafford Anfinson received ancef for preoperative antibiotics.   I made a lateral incision of roughly 7 cm dissection was carried down sharply to the distal fibula and then spreading dissection was used proximally to protect the superficial peroneal nerve. I sharply incised the periosteum and took care to protect the peroneal tendons. I then debrided the fracture site and performed a reduction maneuver which was held in place with a clamp.   I placed  a lag screw across the fracture  I then selected a 7-hole one third tubular plate and placed in a neutralization fashion care was taken distally so as not to penetrate the joint with the cancellus screws.  I then stressed the syndesmosis and it was unstable for syndesmotic fixation I performed a reduction maneuver with a clamp and placed a tightrope  The wound was then thoroughly irrigated and closed using a 0 Vicryl and absorbable Monocryl sutures. He was placed in a short leg splint.   POST OPERATIVE PLAN: Non-weightbearing. DVT prophylaxis will consist of mobilization and chemical px

## 2020-04-28 NOTE — Anesthesia Procedure Notes (Signed)
Procedure Name: LMA Insertion Date/Time: 04/28/2020 2:43 PM Performed by: Lorelee Market, CRNA Pre-anesthesia Checklist: Patient identified, Emergency Drugs available, Suction available and Patient being monitored Patient Re-evaluated:Patient Re-evaluated prior to induction Oxygen Delivery Method: Circle system utilized Preoxygenation: Pre-oxygenation with 100% oxygen Induction Type: IV induction LMA: LMA inserted LMA Size: 5.0 Tube type: Oral Number of attempts: 1 Placement Confirmation: positive ETCO2 and breath sounds checked- equal and bilateral Tube secured with: Tape Dental Injury: Teeth and Oropharynx as per pre-operative assessment

## 2020-04-28 NOTE — Discharge Instructions (Signed)
HOME CARE INSTRUCTIONS  . Remove items at home which could result in a fall. This includes throw rugs or furniture in walking pathways.   ICE to the affected hip as frequently as 20-30 minutes an hour and then as needed for pain and swelling.  Continue to use ice on the hip for pain and swelling from surgery. You may notice swelling that will progress down to the foot and ankle.  This is normal after surgery. Elevate the extremeity    Continue to use the breathing machine which will help keep your temperature down.  It is common for your temperature to cycle up and down following surgery, especially at night when you are not up moving around and exerting yourself.  The breathing machine keeps your lungs expanded and your temperature down.  DIET You may resume your previous home diet once you are discharged from the hospital.  DRESSING / WOUND CARE / SHOWERING . Maintain dressing until follow up. . Cover entire splint and dressing in a bag to keep dry when bathing and do not get it into the water.  . .  ACTIVITY . For the first 3-5 days it is important to rest and keep the operative leg elevated. You should, as a general rule, rest for 50 minutes per hour and get up and walk/stretch for 10 minutes per hour. After 5 days you can slowly increase activity as tolerated. .   WEIGHT BEARING Non weight bearing to the affected extrmeity.  POSTOPERATIVE CONSTIPATION PROTOCOL Constipation - defined medically as fewer than three stools per week and severe constipation as less than one stool per week.  One of the most common issues patients have following surgery is constipation.  Even if you have a regular bowel pattern at home, your normal regimen is likely to be disrupted due to multiple reasons following surgery.  Combination of anesthesia, postoperative narcotics, change in appetite and fluid intake all can affect your bowels.  In order to avoid complications following surgery, here are some  recommendations in order to help you during your recovery period.  Colace (docusate) - Pick up an over-the-counter form of Colace or another stool softener and take twice a day as long as you are requiring postoperative pain medications.  Take with a full glass of water daily.  If you experience loose stools or diarrhea, hold the colace until you stool forms back up.  If your symptoms do not get better within 1 week or if they get worse, check with your doctor  MiraLax (polyethylene glycol) - Pick up over-the-counter to have on hand.  MiraLax is a solution that will increase the amount of water in your bowels to assist with bowel movements.  Take as directed and can mix with a glass of water, juice, soda, coffee, or tea.  Take if you go more than two days without a movement. Do not use MiraLax more than once per day. Call your doctor if you are still constipated or irregular after using this medication for 5 days in a row.  If you continue to have problems with postoperative constipation, please contact the office for further assistance and recommendations.  If you experience "the worst abdominal pain ever" or develop nausea or vomiting, please contact the office immediatly for further recommendations for treatment.  ITCHING  If you experience itching with your medications, try taking only a single pain pill, or even half a pain pill at a time.  You can also use Benadryl over the  counter for itching or also to help with sleep.     MEDICATIONS See your medication summary on the "After Visit Summary" that the nursing staff will review with you prior to discharge.  You may have some home medications which will be placed on hold until you complete the course of blood thinner medication.  It is important for you to complete the blood thinner medication as prescribed by your surgeon.  Continue your approved medications as instructed at time of discharge.  PRECAUTIONS If you experience chest pain or  shortness of breath - call 911 immediately for transfer to the hospital emergency department.  If you develop a fever greater that 101 F, purulent drainage from wound, increased redness or drainage from wound, foul odor from the wound/dressing, or calf pain - CONTACT YOUR SURGEON.                                                   FOLLOW-UP APPOINTMENTS Make sure you keep all of your appointments after your operation with your surgeon and caregivers. You should call the office at the above phone number and make an appointment for approximately two weeks after the date of your surgery or on the date instructed by your surgeon outlined in the "After Visit Summary".  MAKE SURE YOU:  . Understand these instructions.  . Get help right away if you are not doing well or get worse.    Pick up stool softner and laxative for home use following surgery while on pain medications. May bath starting three days after surgery. Continue to use ice for pain and swelling after surgery. Do not use any lotions or creams on the incision until instructed by your surgeon.     Post Anesthesia Home Care Instructions  Activity: Get plenty of rest for the remainder of the day. A responsible individual must stay with you for 24 hours following the procedure.  For the next 24 hours, DO NOT: -Drive a car -Advertising copywriter -Drink alcoholic beverages -Take any medication unless instructed by your physician -Make any legal decisions or sign important papers.  Meals: Start with liquid foods such as gelatin or soup. Progress to regular foods as tolerated. Avoid greasy, spicy, heavy foods. If nausea and/or vomiting occur, drink only clear liquids until the nausea and/or vomiting subsides. Call your physician if vomiting continues.  Special Instructions/Symptoms: Your throat may feel dry or sore from the anesthesia or the breathing tube placed in your throat during surgery. If this causes discomfort, gargle with warm  salt water. The discomfort should disappear within 24 hours.  Do not take any Tylenol until after 5:30 pm today.

## 2020-04-29 ENCOUNTER — Encounter (HOSPITAL_BASED_OUTPATIENT_CLINIC_OR_DEPARTMENT_OTHER): Payer: Self-pay | Admitting: Orthopedic Surgery

## 2020-04-29 NOTE — Addendum Note (Signed)
Addendum  created 04/29/20 0829 by Francie Massing, CRNA   Charge Capture section accepted

## 2020-04-30 NOTE — Progress Notes (Signed)
1 cc of versed was wasted on 04-30-20 by Eshani Maestre rn and tracey johnson rn.

## 2020-05-08 DIAGNOSIS — S82841D Displaced bimalleolar fracture of right lower leg, subsequent encounter for closed fracture with routine healing: Secondary | ICD-10-CM | POA: Diagnosis not present

## 2020-06-05 DIAGNOSIS — S82841D Displaced bimalleolar fracture of right lower leg, subsequent encounter for closed fracture with routine healing: Secondary | ICD-10-CM | POA: Diagnosis not present

## 2020-06-16 DIAGNOSIS — M6281 Muscle weakness (generalized): Secondary | ICD-10-CM | POA: Diagnosis not present

## 2020-06-16 DIAGNOSIS — M25671 Stiffness of right ankle, not elsewhere classified: Secondary | ICD-10-CM | POA: Diagnosis not present

## 2020-06-16 DIAGNOSIS — M25571 Pain in right ankle and joints of right foot: Secondary | ICD-10-CM | POA: Diagnosis not present

## 2020-06-16 DIAGNOSIS — R262 Difficulty in walking, not elsewhere classified: Secondary | ICD-10-CM | POA: Diagnosis not present

## 2020-06-18 DIAGNOSIS — M25671 Stiffness of right ankle, not elsewhere classified: Secondary | ICD-10-CM | POA: Diagnosis not present

## 2020-06-18 DIAGNOSIS — M6281 Muscle weakness (generalized): Secondary | ICD-10-CM | POA: Diagnosis not present

## 2020-06-18 DIAGNOSIS — M25571 Pain in right ankle and joints of right foot: Secondary | ICD-10-CM | POA: Diagnosis not present

## 2020-06-18 DIAGNOSIS — R262 Difficulty in walking, not elsewhere classified: Secondary | ICD-10-CM | POA: Diagnosis not present

## 2020-06-23 DIAGNOSIS — M25571 Pain in right ankle and joints of right foot: Secondary | ICD-10-CM | POA: Diagnosis not present

## 2020-06-23 DIAGNOSIS — R262 Difficulty in walking, not elsewhere classified: Secondary | ICD-10-CM | POA: Diagnosis not present

## 2020-06-23 DIAGNOSIS — M25671 Stiffness of right ankle, not elsewhere classified: Secondary | ICD-10-CM | POA: Diagnosis not present

## 2020-06-23 DIAGNOSIS — M6281 Muscle weakness (generalized): Secondary | ICD-10-CM | POA: Diagnosis not present

## 2020-06-25 DIAGNOSIS — M25571 Pain in right ankle and joints of right foot: Secondary | ICD-10-CM | POA: Diagnosis not present

## 2020-06-25 DIAGNOSIS — M25671 Stiffness of right ankle, not elsewhere classified: Secondary | ICD-10-CM | POA: Diagnosis not present

## 2020-06-25 DIAGNOSIS — R262 Difficulty in walking, not elsewhere classified: Secondary | ICD-10-CM | POA: Diagnosis not present

## 2020-06-25 DIAGNOSIS — M6281 Muscle weakness (generalized): Secondary | ICD-10-CM | POA: Diagnosis not present

## 2020-06-30 DIAGNOSIS — M6281 Muscle weakness (generalized): Secondary | ICD-10-CM | POA: Diagnosis not present

## 2020-06-30 DIAGNOSIS — S82841D Displaced bimalleolar fracture of right lower leg, subsequent encounter for closed fracture with routine healing: Secondary | ICD-10-CM | POA: Diagnosis not present

## 2020-06-30 DIAGNOSIS — M25671 Stiffness of right ankle, not elsewhere classified: Secondary | ICD-10-CM | POA: Diagnosis not present

## 2020-06-30 DIAGNOSIS — R262 Difficulty in walking, not elsewhere classified: Secondary | ICD-10-CM | POA: Diagnosis not present

## 2020-07-02 DIAGNOSIS — M25671 Stiffness of right ankle, not elsewhere classified: Secondary | ICD-10-CM | POA: Diagnosis not present

## 2020-07-02 DIAGNOSIS — M6281 Muscle weakness (generalized): Secondary | ICD-10-CM | POA: Diagnosis not present

## 2020-07-02 DIAGNOSIS — R262 Difficulty in walking, not elsewhere classified: Secondary | ICD-10-CM | POA: Diagnosis not present

## 2020-07-02 DIAGNOSIS — S82841D Displaced bimalleolar fracture of right lower leg, subsequent encounter for closed fracture with routine healing: Secondary | ICD-10-CM | POA: Diagnosis not present

## 2020-07-13 DIAGNOSIS — M25671 Stiffness of right ankle, not elsewhere classified: Secondary | ICD-10-CM | POA: Diagnosis not present

## 2020-07-19 DIAGNOSIS — I10 Essential (primary) hypertension: Secondary | ICD-10-CM | POA: Diagnosis not present

## 2020-07-19 DIAGNOSIS — E7800 Pure hypercholesterolemia, unspecified: Secondary | ICD-10-CM | POA: Diagnosis not present

## 2020-07-28 DIAGNOSIS — E785 Hyperlipidemia, unspecified: Secondary | ICD-10-CM | POA: Diagnosis not present

## 2020-08-31 DIAGNOSIS — E785 Hyperlipidemia, unspecified: Secondary | ICD-10-CM | POA: Diagnosis not present

## 2020-08-31 DIAGNOSIS — E559 Vitamin D deficiency, unspecified: Secondary | ICD-10-CM | POA: Diagnosis not present

## 2020-08-31 DIAGNOSIS — I10 Essential (primary) hypertension: Secondary | ICD-10-CM | POA: Diagnosis not present

## 2020-08-31 DIAGNOSIS — R7303 Prediabetes: Secondary | ICD-10-CM | POA: Diagnosis not present

## 2020-08-31 DIAGNOSIS — N529 Male erectile dysfunction, unspecified: Secondary | ICD-10-CM | POA: Diagnosis not present

## 2020-08-31 DIAGNOSIS — Z0000 Encounter for general adult medical examination without abnormal findings: Secondary | ICD-10-CM | POA: Diagnosis not present

## 2020-09-14 DIAGNOSIS — E7800 Pure hypercholesterolemia, unspecified: Secondary | ICD-10-CM | POA: Diagnosis not present

## 2020-09-14 DIAGNOSIS — E785 Hyperlipidemia, unspecified: Secondary | ICD-10-CM | POA: Diagnosis not present

## 2020-09-14 DIAGNOSIS — I10 Essential (primary) hypertension: Secondary | ICD-10-CM | POA: Diagnosis not present

## 2020-12-06 DIAGNOSIS — E785 Hyperlipidemia, unspecified: Secondary | ICD-10-CM | POA: Diagnosis not present

## 2020-12-06 DIAGNOSIS — E7800 Pure hypercholesterolemia, unspecified: Secondary | ICD-10-CM | POA: Diagnosis not present

## 2020-12-06 DIAGNOSIS — I10 Essential (primary) hypertension: Secondary | ICD-10-CM | POA: Diagnosis not present

## 2021-01-10 DIAGNOSIS — Z20822 Contact with and (suspected) exposure to covid-19: Secondary | ICD-10-CM | POA: Diagnosis not present

## 2021-03-10 DIAGNOSIS — E785 Hyperlipidemia, unspecified: Secondary | ICD-10-CM | POA: Diagnosis not present

## 2021-03-10 DIAGNOSIS — R7303 Prediabetes: Secondary | ICD-10-CM | POA: Diagnosis not present

## 2021-03-10 DIAGNOSIS — I10 Essential (primary) hypertension: Secondary | ICD-10-CM | POA: Diagnosis not present

## 2021-08-16 DIAGNOSIS — H2513 Age-related nuclear cataract, bilateral: Secondary | ICD-10-CM | POA: Diagnosis not present

## 2021-08-16 DIAGNOSIS — H40033 Anatomical narrow angle, bilateral: Secondary | ICD-10-CM | POA: Diagnosis not present

## 2021-09-16 ENCOUNTER — Ambulatory Visit: Payer: Medicare HMO | Admitting: Physician Assistant

## 2021-09-16 ENCOUNTER — Other Ambulatory Visit: Payer: Self-pay

## 2021-09-16 DIAGNOSIS — J069 Acute upper respiratory infection, unspecified: Secondary | ICD-10-CM | POA: Diagnosis not present

## 2021-09-16 MED ORDER — BENZONATATE 200 MG PO CAPS: 200.0000 mg | ORAL_CAPSULE | Freq: Two times a day (BID) | ORAL | 0 refills | Status: DC | PRN

## 2021-09-16 NOTE — Progress Notes (Signed)
New Patient Office Visit  Subjective:  Patient ID: Aaron Gregory, male    DOB: 02/02/1946  Age: 75 y.o. MRN: [id number]  CC:  Chief Complaint  Patient presents with   Cough   Virtual Visit via Telephone Note  I connected with [name] on 09/16/21 at 10:00 AM EST by telephone and verified that I am speaking with the correct person using two identifiers.  Location: Patient: Home   Provider: Primary Care at [facility]   I discussed the limitations, risks, security and privacy concerns of performing an evaluation and management service by telephone and the availability of in person appointments. I also discussed with the patient that there may be a patient responsible charge related to this service. The patient expressed understanding and agreed to proceed.   History of Present Illness: Reports that he started having a dry cough, nasal and chest congestion on Monday, states that he did have a temperature of 99 on Monday, but does feel that he has a normal temperature now.  Reports that he has been having difficulty sleeping due to the cough.  States that he will occasionally be able to expel some sputum, states when he does it is  faintly yellow.  States that he has tried over-the-counter nasal spray and NyQuil with some relief.  States that his wife has similar symptoms.  Has not had home COVID test.  States that he did have 2 COVID vaccines, no recent flu vaccine.  Observations/Objective: Medical history and current medications reviewed, no physical exam completed  Past Medical History:  Diagnosis Date   Arthritis    lower back bulging discs x 2   Closed right ankle fracture 04/15/2020   fell down has cast on   Headache    Hepatitis    heaptitis a 1970's   Hyperlipemia    Hypertension     Past Surgical History:  Procedure Laterality Date   MULTIPLE TOOTH EXTRACTIONS  2017   ORIF ANKLE FRACTURE Right 04/28/2020   Procedure: OPEN REDUCTION INTERNAL FIXATION (ORIF) ANKLE  FRACTURE;  Surgeon: Sheral Apley, MD;  Location: [facility] [place];  Service: Orthopedics;  Laterality: Right;   PERIRECTAL ABSCESS SURGERY  1970'S    History reviewed. No pertinent family history.  Social History   Socioeconomic History   Marital status: Married    Spouse name: Not on file   Number of children: Not on file   Years of education: Not on file   Highest education level: Not on file  Occupational History   Not on file  Tobacco Use   Smoking status: Former    Packs/day: 0.25    Years: 2.00    Pack years: 0.50    Types: Cigarettes    Quit date: 06/19/1968    Years since quitting: 53.2   Smokeless tobacco: Never  Vaping Use   Vaping Use: Never used  Substance and Sexual Activity   Alcohol use: Yes    Comment: occ 2 drinks per week   Drug use: Never   Sexual activity: Yes  Other Topics Concern   Not on file  Social History Narrative   Not on file   Social Determinants of Health   Financial Resource Strain: Not on file  Food Insecurity: Not on file  Transportation Needs: Not on file  Physical Activity: Not on file  Stress: Not on file  Social Connections: Not on file  Intimate Partner Violence: Not on file    ROS Review of Systems  Constitutional:  Negative for chills and fever.  HENT:  Positive for congestion and rhinorrhea. Negative for ear pain, sinus pressure, sinus pain, sneezing and sore throat.   Eyes: Negative.   Respiratory:  Positive for cough. Negative for shortness of breath and wheezing.   Cardiovascular:  Negative for chest pain.  Gastrointestinal: Negative.   Endocrine: Negative.   Genitourinary: Negative.   Musculoskeletal: Negative.  Negative for myalgias.  Skin: Negative.   Allergic/Immunologic: Negative.   Neurological:  Negative for headaches.  Hematological: Negative.   Psychiatric/Behavioral: Negative.     Objective:   Today's Vitals: There were no vitals taken for this visit.    Assessment & Plan:    Problem List Items Addressed This Visit   None Visit Diagnoses     Viral upper respiratory tract infection    -  Primary   Relevant Medications   benzonatate (TESSALON) 200 MG capsule       Outpatient Encounter Medications as of 09/16/2021  Medication Sig   amLODipine (NORVASC) 5 MG tablet Take 5 mg by mouth daily.   atorvastatin (LIPITOR) 80 MG tablet Take 80 mg by mouth daily.   benzonatate (TESSALON) 200 MG capsule Take 1 capsule (200 mg total) by mouth 2 (two) times daily as needed for cough.   lisinopril-hydrochlorothiazide (ZESTORETIC) 20-25 MG tablet Take 1 tablet by mouth daily.   tadalafil (CIALIS) 20 MG tablet Take 20 mg by mouth daily as needed for erectile dysfunction.   traMADol (ULTRAM) 50 MG tablet Take by mouth every 6 (six) hours as needed.   UNABLE TO FIND Ed medication  Prn pt does know name and med not filled by pharmacy   VITAMIN D, CHOLECALCIFEROL, PO Take by mouth. DAILY   No facility-administered encounter medications on file as of 09/16/2021.   Assessment and Plan:   1. Viral upper respiratory tract infection Trial tessalon pearles, patient education given on supportive care, proper use of OTC cold medications, red flags given for prompt reevaluation - benzonatate (TESSALON) 200 MG capsule; Take 1 capsule (200 mg total) by mouth 2 (two) times daily as needed for cough.  Dispense: 20 capsule; Refill: 0  Follow Up Instructions:    I discussed the assessment and treatment plan with the patient. The patient was provided an opportunity to ask questions and all were answered. The patient agreed with the plan and demonstrated an understanding of the instructions.   The patient was advised to call back or seek an in-person evaluation if the symptoms worsen or if the condition fails to improve as anticipated.  I provided 21 minutes of non-face-to-face time during this encounter.  No AVS is created, no My Chart access at this time   Follow-up: Return if  symptoms worsen or fail to improve.   Kasandra Knudsen Mayers, PA-C

## 2021-09-16 NOTE — Progress Notes (Signed)
Patient reports Sx beginning Monday afternoon. Patient reports nasal and chest congestion. Patient has a dry cough. Patient reports pressure in the head prominently to the left side when coughing. Patient had low grade fever. Patient is able to eat and drink. Patient is having interrupted sleeping. Patient has not taken medication today. Patient has taken nasal spray and nyquill at night. Last BP check was Sunday afternoon and reports it being normal.

## 2021-11-06 NOTE — Progress Notes (Signed)
Subjective:    Aaron Gregory - 76 y.o. male MRN [id number]  Date of birth: 1946/05/15  HPI  Aaron Gregory is to establish care.  Current issues and/or concerns: HYPERTENSION: Currently taking: see medication list Med Adherence: [x]  Yes    []  No Medication side effects: []  Yes    [x]  No Adherence with salt restriction (low-salt diet): [x]  Yes    []  No Exercise: Yes [x]  No []  Home Monitoring?: [x]  Yes    []  No Monitoring Frequency: [x]  Yes    []  No Home BP results range: [x]  Yes, 110's-130's/70's-80's Smoking []  Yes [x]  No SOB? []  Yes    [x]  No Chest Pain?: []  Yes    [x]  No  2. HIGH CHOLESTEROL: Doing well on current regimen, no issues/concerns.   3. ENLARGED PROSTATE: Established with Urology who is monitoring.   4. HISTORY RIGHT ANKLE FRACTURE: Medically released from Orthopedics. Walking well. No recent falls. Noticed numbness improved since exercising.    ROS per HPI     Health Maintenance:  Health Maintenance Due  Topic Date Due   Pneumonia Vaccine 47+ Years old (1 - PCV) 12/03/1951   Hepatitis C Screening  Never done   Zoster Vaccines- Shingrix (1 of 2) Never done   COVID-19 Vaccine (3 - Booster for series) 02/07/2020     Past Medical History: Patient Active Problem List   Diagnosis Date Noted   Acute upper respiratory infection 11/12/2021   Chronic sinusitis 11/12/2021   Erectile dysfunction 11/12/2021   Essential hypertension 11/12/2021   Other and unspecified hyperlipidemia 11/12/2021   Hypertrophy of prostate without urinary obstruction and other lower urinary tract symptoms (LUTS) 11/12/2021   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 His smoking use included cigarettes. He  has a 0.50 pack-year smoking history. He has never used smokeless tobacco. He reports current alcohol use. He reports that he does not use drugs.   Family History  family history is not on file.   Medications: reviewed and updated   Objective:   Physical Exam BP 129/87 (BP Location: Left Arm, Patient Position: Sitting, Cuff Size: Large)    Pulse 83    Temp 98.3 F (36.8 C)    Resp 18    Ht 5\' 7"  (1.702 m)    Wt 190 lb (86.2 kg)    SpO2 97%    BMI 29.76 kg/m   Physical Exam HENT:     Head: Normocephalic and atraumatic.  Eyes:     Extraocular Movements: Extraocular movements intact.     Conjunctiva/sclera: Conjunctivae normal.     Pupils: Pupils are equal, round, and reactive to light.  Cardiovascular:     Rate and Rhythm: Normal rate and regular rhythm.     Pulses: Normal pulses.     Heart sounds: Normal heart sounds.  Pulmonary:     Effort: Pulmonary effort is normal.     Breath sounds: Normal breath sounds.  Musculoskeletal:     Cervical back: Normal range of motion and neck supple.  Neurological:     General: No focal deficit present.     Mental Status: He is alert and oriented to person, place, and time.  Psychiatric:        Mood and Affect: Mood  normal.        Behavior: Behavior normal.       Assessment & Plan:  1. Encounter to establish care: - Patient presents today to establish care.  - Return for annual physical examination, labs, and health maintenance. Arrive fasting meaning having no food for at least 8 hours prior to appointment. You may have only water or black coffee. Please take scheduled medications as normal.  2. Essential (primary) hypertension: - Continue Amlodipine and Lisinopril-Hydrochlorothiazide as prescribed. - Counseled on blood pressure goal of less than 140/90, low-sodium, DASH diet, medication compliance, 150 minutes of moderate intensity exercise per week as tolerated. Discussed medication compliance, adverse effects. - BMP to evaluate  kidney function and electrolyte balance. - Follow-up with primary provider in 3 months or sooner if needed.  - Basic Metabolic Panel - amLODipine (NORVASC) 5 MG tablet; Take 1 tablet (5 mg total) by mouth daily.  Dispense: 90 tablet; Refill: 0 - lisinopril-hydrochlorothiazide (ZESTORETIC) 20-25 MG tablet; Take 1 tablet by mouth daily.  Dispense: 90 tablet; Refill: 0  3. Hyperlipidemia, unspecified hyperlipidemia type: - Practice low-fat heart healthy diet and at least 150 minutes of moderate intensity exercise weekly as tolerated.  - Continue Atorvastatin as prescribed.  - Follow-up with primary provider as scheduled.  - atorvastatin (LIPITOR) 80 MG tablet; Take 1 tablet (80 mg total) by mouth daily.  Dispense: 90 tablet; Refill: 1  4. Benign prostatic hyperplasia, unspecified whether lower urinary tract symptoms present: - Keep all scheduled appointments with Urology.   5. History of fracture of right ankle: - Update primary provider if needs referral back to Orthopedics.     Patient was given clear instructions to go to Emergency Department or return to medical center if symptoms don't improve, worsen, or new problems develop.The patient verbalized understanding.  I discussed the assessment and treatment plan with the patient. The patient was provided an opportunity to ask questions and all were answered. The patient agreed with the plan and demonstrated an understanding of the instructions.   The patient was advised to call back or seek an in-person evaluation if the symptoms worsen or if the condition fails to improve as anticipated.    Ricky Stabs, NP 11/12/2021, 8:50 AM Primary Care at [facility]

## 2021-11-12 ENCOUNTER — Encounter: Payer: Self-pay | Admitting: Family

## 2021-11-12 ENCOUNTER — Other Ambulatory Visit: Payer: Self-pay

## 2021-11-12 ENCOUNTER — Ambulatory Visit (INDEPENDENT_AMBULATORY_CARE_PROVIDER_SITE_OTHER): Payer: Medicare HMO | Admitting: Family

## 2021-11-12 VITALS — BP 129/87 | HR 83 | Temp 98.3°F | Resp 18 | Ht 67.0 in | Wt 190.0 lb

## 2021-11-12 DIAGNOSIS — E785 Hyperlipidemia, unspecified: Secondary | ICD-10-CM

## 2021-11-12 DIAGNOSIS — N400 Enlarged prostate without lower urinary tract symptoms: Secondary | ICD-10-CM | POA: Diagnosis not present

## 2021-11-12 DIAGNOSIS — Z7689 Persons encountering health services in other specified circumstances: Secondary | ICD-10-CM | POA: Diagnosis not present

## 2021-11-12 DIAGNOSIS — G622 Polyneuropathy due to other toxic agents: Secondary | ICD-10-CM | POA: Insufficient documentation

## 2021-11-12 DIAGNOSIS — E559 Vitamin D deficiency, unspecified: Secondary | ICD-10-CM | POA: Insufficient documentation

## 2021-11-12 DIAGNOSIS — E7800 Pure hypercholesterolemia, unspecified: Secondary | ICD-10-CM | POA: Insufficient documentation

## 2021-11-12 DIAGNOSIS — N529 Male erectile dysfunction, unspecified: Secondary | ICD-10-CM | POA: Insufficient documentation

## 2021-11-12 DIAGNOSIS — J069 Acute upper respiratory infection, unspecified: Secondary | ICD-10-CM | POA: Insufficient documentation

## 2021-11-12 DIAGNOSIS — R7303 Prediabetes: Secondary | ICD-10-CM | POA: Insufficient documentation

## 2021-11-12 DIAGNOSIS — I10 Essential (primary) hypertension: Secondary | ICD-10-CM | POA: Insufficient documentation

## 2021-11-12 DIAGNOSIS — Z8781 Personal history of (healed) traumatic fracture: Secondary | ICD-10-CM

## 2021-11-12 DIAGNOSIS — F528 Other sexual dysfunction not due to a substance or known physiological condition: Secondary | ICD-10-CM | POA: Insufficient documentation

## 2021-11-12 DIAGNOSIS — J329 Chronic sinusitis, unspecified: Secondary | ICD-10-CM | POA: Insufficient documentation

## 2021-11-12 DIAGNOSIS — R070 Pain in throat: Secondary | ICD-10-CM | POA: Insufficient documentation

## 2021-11-12 MED ORDER — ATORVASTATIN CALCIUM 80 MG PO TABS: 80.0000 mg | ORAL_TABLET | Freq: Every day | ORAL | 1 refills | Status: DC

## 2021-11-12 MED ORDER — AMLODIPINE BESYLATE 5 MG PO TABS: 5.0000 mg | ORAL_TABLET | Freq: Every day | ORAL | 0 refills | Status: DC

## 2021-11-12 MED ORDER — LISINOPRIL-HYDROCHLOROTHIAZIDE 20-25 MG PO TABS: 1.0000 | ORAL_TABLET | Freq: Every day | ORAL | 0 refills | Status: DC

## 2021-11-12 NOTE — Progress Notes (Signed)
Pt presents to establish care 

## 2021-11-12 NOTE — Patient Instructions (Addendum)
Thank you for choosing Primary Care at [facility] for your medical home!    Kathy Teagle was seen by Camillia Herter, NP today.   Baldwin [place] primary care provider is Durene Fruits, NP.   For the best care possible,  you should try to see Durene Fruits, NP whenever you come to clinic.   We look forward to seeing you again soon!  If you have any questions about your visit today,  please call us at [phone]  Or feel free to reach your provider via [employer].    Keeping you healthy   Get these tests Blood pressure- Have your blood pressure checked once a year by your healthcare provider.  Normal blood pressure is 120/80. Weight- Have your body mass index (BMI) calculated to screen for obesity.  BMI is a measure of body fat based on height and weight. You can also calculate your own BMI at [employer]velBags.it. Cholesterol- Have your cholesterol checked regularly starting at age 80, sooner may be necessary if you have diabetes, high blood pressure, if a family member developed heart diseases at an early age or if you smoke.  Chlamydia, HIV, and other sexual transmitted disease- Get screened each year until the age of 73 then within three months of each new sexual partner. Diabetes- Have your blood sugar checked regularly if you have high blood pressure, high cholesterol, a family history of diabetes or if you are overweight.   Get these vaccines Flu shot- Every fall. Tetanus shot- Every 10 years. Menactra- Single dose; prevents meningitis.   Take these steps Don't smoke- If you do smoke, ask your healthcare provider about quitting. For tips on how to quit, go to www.smokefree.gov or call 1-800-QUIT-NOW. Be physically active- Exercise 5 days a week for at least 30 minutes.  If you are not already physically active start slow and gradually work up to 30 minutes of moderate physical activity.  Examples of moderate activity include walking briskly, mowing the yard, dancing, swimming  bicycling, etc. Eat a healthy diet- Eat a variety of healthy foods such as fruits, vegetables, low fat milk, low fat cheese, yogurt, lean meats, poultry, fish, beans, tofu, etc.  For more information on healthy eating, go to www.thenutritionsource.org Drink alcohol in moderation- Limit alcohol intake two drinks or less a day.  Never drink and drive. Dentist- Brush and floss teeth twice daily; visit your dentis twice a year. Depression-Your emotional health is as important as your physical health.  If you're feeling down, losing interest in things you normally enjoy please talk with your healthcare provider. Gun Safety- If you keep a gun in your home, keep it unloaded and with the safety lock on.  Bullets should be stored separately. Helmet use- Always wear a helmet when riding a motorcycle, bicycle, rollerblading or skateboarding. Safe sex- If you may be exposed to a sexually transmitted infection, use a condom Seat belts- Seat bels can save your life; always wear one. Smoke/Carbon Monoxide detectors- These detectors need to be installed on the appropriate level of your home.  Replace batteries at least once a year. Skin Cancer- When out in the sun, cover up and use sunscreen SPF 15 or higher. Violence- If anyone is threatening or hurting you, please tell your healthcare provider.

## 2021-11-13 LAB — BASIC METABOLIC PANEL
BUN/Creatinine Ratio: 14 (ref 10–24)
BUN: 15 mg/dL (ref 8–27)
CO2: 26 mmol/L (ref 20–29)
Calcium: 10.3 mg/dL — ABNORMAL HIGH (ref 8.6–10.2)
Chloride: 102 mmol/L (ref 96–106)
Creatinine, Ser: 1.08 mg/dL (ref 0.76–1.27)
Glucose: 113 mg/dL — ABNORMAL HIGH (ref 70–99)
Potassium: 4.4 mmol/L (ref 3.5–5.2)
Sodium: 142 mmol/L (ref 134–144)
eGFR: 71 mL/min/{1.73_m2} (ref >59)

## 2021-11-13 NOTE — Progress Notes (Signed)
Kidney function normal

## 2022-01-19 ENCOUNTER — Ambulatory Visit (INDEPENDENT_AMBULATORY_CARE_PROVIDER_SITE_OTHER): Payer: Medicare HMO | Admitting: Family

## 2022-01-19 ENCOUNTER — Encounter: Payer: Self-pay | Admitting: Family

## 2022-01-19 VITALS — BP 122/79 | HR 80 | Temp 98.3°F | Resp 18 | Ht 67.01 in | Wt 190.0 lb

## 2022-01-19 DIAGNOSIS — Z1159 Encounter for screening for other viral diseases: Secondary | ICD-10-CM

## 2022-01-19 DIAGNOSIS — Z0000 Encounter for general adult medical examination without abnormal findings: Secondary | ICD-10-CM | POA: Diagnosis not present

## 2022-01-19 DIAGNOSIS — Z1329 Encounter for screening for other suspected endocrine disorder: Secondary | ICD-10-CM

## 2022-01-19 DIAGNOSIS — Z131 Encounter for screening for diabetes mellitus: Secondary | ICD-10-CM

## 2022-01-19 DIAGNOSIS — Z13228 Encounter for screening for other metabolic disorders: Secondary | ICD-10-CM | POA: Diagnosis not present

## 2022-01-19 DIAGNOSIS — Z1211 Encounter for screening for malignant neoplasm of colon: Secondary | ICD-10-CM | POA: Diagnosis not present

## 2022-01-19 DIAGNOSIS — I10 Essential (primary) hypertension: Secondary | ICD-10-CM

## 2022-01-19 DIAGNOSIS — E785 Hyperlipidemia, unspecified: Secondary | ICD-10-CM | POA: Diagnosis not present

## 2022-01-19 MED ORDER — AMLODIPINE BESYLATE 5 MG PO TABS: 5.0000 mg | ORAL_TABLET | Freq: Every day | ORAL | 0 refills | Status: DC

## 2022-01-19 MED ORDER — LISINOPRIL-HYDROCHLOROTHIAZIDE 20-25 MG PO TABS: 1.0000 | ORAL_TABLET | Freq: Every day | ORAL | 0 refills | Status: DC

## 2022-01-19 NOTE — Progress Notes (Signed)
? ?Subjective:  ? Aaron Gregory is a 76 y.o. male who presents for Medicare Annual/Subsequent preventive examination. ? ?Review of Systems    ?1. Hypertension follow-up: ?11/12/2021: ?- Continue Amlodipine and Lisinopril-Hydrochlorothiazide as prescribed. ? ?01/19/2022: ?Doing well on current regimen. No side effects. No issues/concerns. Denies chest pain, shortness of breath, worst headache of life and additional red flag symptoms.  ? ?2. Hyperlipidemia follow-up: ?11/12/2021: ?- Continue Atorvastatin as prescribed.  ? ?01/19/2022: ?Doing well on current regimen, no issues/concerns.  ? ?Objective:  ?  ?Vitals:  ? 01/19/22 1323  ?BP: 122/79  ?Pulse: 80  ?Resp: 18  ?Temp: 98.3 ?F (36.8 ?C)  ?SpO2: 98%  ? ? ?Body mass index is 29.75 kg/m?. ? ?Physical Exam ?HENT:  ?   Head: Normocephalic and atraumatic.  ?   Right Ear: Tympanic membrane, ear canal and external ear normal.  ?   Left Ear: Tympanic membrane, ear canal and external ear normal.  ?   Nose: Nose normal.  ?   Mouth/Throat:  ?   Mouth: Mucous membranes are moist.  ?   Pharynx: Oropharynx is clear.  ?Eyes:  ?   Extraocular Movements: Extraocular movements intact.  ?   Conjunctiva/sclera: Conjunctivae normal.  ?   Pupils: Pupils are equal, round, and reactive to light.  ?Cardiovascular:  ?   Rate and Rhythm: Normal rate and regular rhythm.  ?   Pulses: Normal pulses.  ?   Heart sounds: Normal heart sounds.  ?Pulmonary:  ?   Effort: Pulmonary effort is normal.  ?   Breath sounds: Normal breath sounds.  ?Abdominal:  ?   General: Bowel sounds are normal.  ?   Palpations: Abdomen is soft.  ?   Tenderness: There is no abdominal tenderness.  ?Genitourinary: ?   Comments: Patient declined exam.  ?Musculoskeletal:     ?   General: Normal range of motion.  ?   Cervical back: Normal range of motion and neck supple.  ?Skin: ?   General: Skin is warm and dry.  ?   Capillary Refill: Capillary refill takes less than 2 seconds.  ?Neurological:  ?   General: No focal deficit present.   ?   Mental Status: He is alert and oriented to person, place, and time.  ?Psychiatric:     ?   Mood and Affect: Mood normal.     ?   Behavior: Behavior normal.  ? ? ?  04/28/2020  ? 11:28 AM  ?Advanced Directives  ?Does Patient Have a Medical Advance Directive? No  ?Would patient like information on creating a medical advance directive? No - Patient declined  ? ? ?Current Medications (verified) ?Outpatient Encounter Medications as of 01/19/2022  ?Medication Sig  ? amLODipine (NORVASC) 5 MG tablet Take 1 tablet (5 mg total) by mouth daily.  ? atorvastatin (LIPITOR) 80 MG tablet Take 1 tablet (80 mg total) by mouth daily.  ? lisinopril-hydrochlorothiazide (ZESTORETIC) 20-25 MG tablet Take 1 tablet by mouth daily.  ? tadalafil (CIALIS) 20 MG tablet Take 20 mg by mouth daily as needed for erectile dysfunction.  ? VITAMIN D, CHOLECALCIFEROL, PO Take by mouth. DAILY  ? ?No facility-administered encounter medications on file as of 01/19/2022.  ? ? ?Allergies (verified) ?Patient has no known allergies.  ? ?History: ?Past Medical History:  ?Diagnosis Date  ? Arthritis   ? lower back bulging discs x 2  ? Closed right ankle fracture 04/15/2020  ? fell down has cast on  ? Headache   ?  Hepatitis   ? heaptitis a 1970's  ? Hyperlipemia   ? Hypertension   ? ?Past Surgical History:  ?Procedure Laterality Date  ? MULTIPLE TOOTH EXTRACTIONS  2017  ? ORIF ANKLE FRACTURE Right 04/28/2020  ? Procedure: OPEN REDUCTION INTERNAL FIXATION (ORIF) ANKLE FRACTURE;  Surgeon: Sheral Apley, MD;  Location: [facility] [place];  Service: Orthopedics;  Laterality: Right;  ? PERIRECTAL ABSCESS SURGERY  1970'S  ? ?No family history on file. ?Social History  ? ?Socioeconomic History  ? Marital status: Married  ?  Spouse name: Not on file  ? Number of children: Not on file  ? Years of education: Not on file  ? Highest education level: Not on file  ?Occupational History  ? Not on file  ?Tobacco Use  ? Smoking status: Former  ?  Packs/day: 0.25  ?   Years: 2.00  ?  Pack years: 0.50  ?  Types: Cigarettes  ?  Quit date: 06/19/1968  ?  Years since quitting: 53.6  ? Smokeless tobacco: Never  ?Vaping Use  ? Vaping Use: Never used  ?Substance and Sexual Activity  ? Alcohol use: Yes  ?  Comment: occ 2 drinks per week  ? Drug use: Never  ? Sexual activity: Yes  ?Other Topics Concern  ? Not on file  ?Social History Narrative  ? Not on file  ? ?Social Determinants of Health  ? ?Financial Resource Strain: Not on file  ?Food Insecurity: Not on file  ?Transportation Needs: Not on file  ?Physical Activity: Not on file  ?Stress: Not on file  ?Social Connections: Not on file  ? ? ?Tobacco Counseling ?Patient reports smoking an occasional cigar. Last time being 3 years ago. ?Counseling given: Yes ? ?Clinical Intake: ?Pre-visit preparation completed: Yes ?Pain : 0-10 ?Pain Score: 0-No pain ? ? ?Diabetic? No ? ?Interpreter Needed?: No ? ?Activities of Daily Living: Doing well without issues or concerns. ? ?Patient Care Team: ?Rema Fendt, NP as PCP - General (Family Medicine) ? ? ?Indicate any recent Medical Services you may have received from other than [facility] providers in the past year (date may be approximate). ? ?   ?Assessment:  ? This is a routine wellness examination for Aaron Gregory. ? ?Hearing/Vision screen ?No results found. ? ?Dietary issues and exercise activities discussed: yes ? ?Goals Addressed: Maintain weight  ? ?Depression Screen ? ?  11/12/2021  ?  8:14 AM 09/16/2021  ? 10:00 AM  ?PHQ 2/9 Scores  ?PHQ - 2 Score 0 0  ?  ?Fall Risk ? ?  09/16/2021  ? 10:00 AM  ?Fall Risk   ?Falls in the past year? 0  ?Number falls in past yr: 0  ?Injury with Fall? 0  ?Follow up Falls evaluation completed  ? ? ?FALL RISK PREVENTION PERTAINING TO THE HOME: ?Any stairs in or around the home? Yes  ?If so, are there any without handrails? No  ?Home free of loose throw rugs in walkways, pet beds, electrical cords, etc? Yes  ?Adequate lighting in your home to reduce risk of falls? Yes   ? ?ASSISTIVE DEVICES UTILIZED TO PREVENT FALLS: ?Life alert? Yes  ?Use of a cane, walker or w/c? No  ?Grab bars in the bathroom? No  ?Shower chair or bench in shower? Yes  ?Elevated toilet seat or a handicapped toilet? No  ? ?TIMED UP AND GO: ?Was the test performed? No .  ?Length of time to ambulate 10 feet: 12 sec.  ? ?Gait steady and  fast without use of assistive device ? ?Immunizations ?Immunization History  ?Administered Date(s) Administered  ? Influenza-Unspecified 07/09/2001, 08/03/2004, 06/19/2006, 07/06/2007, 05/27/2008  ? PFIZER(Purple Top)SARS-COV-2 Vaccination 11/21/2019, 12/13/2019  ? Pneumococcal-Unspecified 12/21/2010  ? ? ?TDAP status: Up to date ? ?Flu Vaccine status: Up to date ? ?Pneumococcal vaccine status: Declined,  Education has been provided regarding the importance of this vaccine but patient still declined. Advised may receive this vaccine at local pharmacy or Health Dept. Aware to provide a copy of the vaccination record if obtained from local pharmacy or Health Dept. Verbalized acceptance and understanding.  ? ?Covid-19 vaccine status: Completed vaccines ? ?Qualifies for Shingles Vaccine? Yes   ?Zostavax completed No   ?Shingrix Completed?: No.   Education has been provided regarding the importance of this vaccine. Patient has been advised to call insurance company to determine out of pocket expense if they have not yet received this vaccine. Advised may also receive vaccine at local pharmacy or Health Dept. Verbalized acceptance and understanding. ? ?Screening Tests ?Health Maintenance  ?Topic Date Due  ? Pneumonia Vaccine 43+ Years old (1 - PCV) 12/03/1951  ? Hepatitis C Screening  Never done  ? Zoster Vaccines- Shingrix (1 of 2) Never done  ? COVID-19 Vaccine (3 - Booster for Pfizer series) 02/07/2020  ? TETANUS/TDAP  11/12/2022 (Originally 12/03/1964)  ? INFLUENZA VACCINE  04/19/2022  ? HPV VACCINES  Aged Out  ? ? ?Health Maintenance ? ?Health Maintenance Due  ?Topic Date Due  ?  Pneumonia Vaccine 93+ Years old (1 - PCV) 12/03/1951  ? Hepatitis C Screening  Never done  ? Zoster Vaccines- Shingrix (1 of 2) Never done  ? COVID-19 Vaccine (3 - Booster for Pfizer series) 02/07/2020  ? ? ?Color

## 2022-01-19 NOTE — Progress Notes (Signed)
? ?Subjective:  ? Aaron Gregory is a 76 y.o. male who presents for Medicare Annual/Subsequent preventive examination. ? ?Review of Systems    ?Defer to PCP  ?  ? ?   ?Objective:  ?  ?Today's Vitals  ? 01/19/22 1323  ?BP: 122/79  ?Pulse: 80  ?Resp: 18  ?Temp: 98.3 ?F (36.8 ?C)  ?SpO2: 98%  ?Weight: 190 lb (86.2 kg)  ?Height: 5' 7.01" (1.702 m)  ?PainSc: 0-No pain  ? ?Body mass index is 29.75 kg/m?. ? ? ?  04/28/2020  ? 11:28 AM  ?Advanced Directives  ?Does Patient Have a Medical Advance Directive? No  ?Would patient like information on creating a medical advance directive? No - Patient declined  ? ? ?Current Medications (verified) ?Outpatient Encounter Medications as of 01/19/2022  ?Medication Sig  ? amLODipine (NORVASC) 5 MG tablet Take 1 tablet (5 mg total) by mouth daily.  ? atorvastatin (LIPITOR) 80 MG tablet Take 1 tablet (80 mg total) by mouth daily.  ? lisinopril-hydrochlorothiazide (ZESTORETIC) 20-25 MG tablet Take 1 tablet by mouth daily.  ? tadalafil (CIALIS) 20 MG tablet Take 20 mg by mouth daily as needed for erectile dysfunction.  ? VITAMIN D, CHOLECALCIFEROL, PO Take by mouth. DAILY  ? ?No facility-administered encounter medications on file as of 01/19/2022.  ? ? ?Allergies (verified) ?Patient has no known allergies.  ? ?History: ?Past Medical History:  ?Diagnosis Date  ? Arthritis   ? lower back bulging discs x 2  ? Closed right ankle fracture 04/15/2020  ? fell down has cast on  ? Headache   ? Hepatitis   ? heaptitis a 1970's  ? Hyperlipemia   ? Hypertension   ? ?Past Surgical History:  ?Procedure Laterality Date  ? MULTIPLE TOOTH EXTRACTIONS  2017  ? ORIF ANKLE FRACTURE Right 04/28/2020  ? Procedure: OPEN REDUCTION INTERNAL FIXATION (ORIF) ANKLE FRACTURE;  Surgeon: Sheral Apley, MD;  Location: [facility] [place];  Service: Orthopedics;  Laterality: Right;  ? PERIRECTAL ABSCESS SURGERY  1970'S  ? ?No family history on file. ?Social History  ? ?Socioeconomic History  ? Marital status: Married  ?   Spouse name: Not on file  ? Number of children: Not on file  ? Years of education: Not on file  ? Highest education level: Not on file  ?Occupational History  ? Not on file  ?Tobacco Use  ? Smoking status: Former  ?  Packs/day: 0.25  ?  Years: 2.00  ?  Pack years: 0.50  ?  Types: Cigarettes  ?  Quit date: 06/19/1968  ?  Years since quitting: 53.6  ? Smokeless tobacco: Never  ?Vaping Use  ? Vaping Use: Never used  ?Substance and Sexual Activity  ? Alcohol use: Yes  ?  Comment: occ 2 drinks per week  ? Drug use: Never  ? Sexual activity: Yes  ?Other Topics Concern  ? Not on file  ?Social History Narrative  ? Not on file  ? ?Social Determinants of Health  ? ?Financial Resource Strain: Not on file  ?Food Insecurity: Not on file  ?Transportation Needs: Not on file  ?Physical Activity: Not on file  ?Stress: Not on file  ?Social Connections: Not on file  ? ? ?Tobacco Counseling ?Counseling given: Not Answered ? ? ?Clinical Intake: ? ?Pre-visit preparation completed: Yes ? ?Pain : 0-10 ?Pain Score: 0-No pain ? ? ?Diabetic?-No ? ?Interpreter Needed?: No ? ?  ? ? ?Activities of Daily Living ?   ? View : No  data to display.  ?  ?  ?  ? ? ?Patient Care Team: ?Rema Fendt, NP as PCP - General (Nurse Practitioner) ? ?Indicate any recent Medical Services you may have received from other than [facility] providers in the past year (date may be approximate). ? ?   ?Assessment:  ? This is a routine wellness examination for Aaron Gregory. ? ?Hearing/Vision screen ?No results found. ? ?Dietary issues and exercise activities discussed: ?  ? ? Goals Addressed   ?None ?  ?Depression Screen ? ?  11/12/2021  ?  8:14 AM 09/16/2021  ? 10:00 AM  ?PHQ 2/9 Scores  ?PHQ - 2 Score 0 0  ?  ?Fall Risk ? ?  09/16/2021  ? 10:00 AM  ?Fall Risk   ?Falls in the past year? 0  ?Number falls in past yr: 0  ?Injury with Fall? 0  ?Follow up Falls evaluation completed  ? ? ?FALL RISK PREVENTION PERTAINING TO THE HOME: ? ?Any stairs in or around the home? Yes  ?If so, are  there any without handrails? No  ?Home free of loose throw rugs in walkways, pet beds, electrical cords, etc? Yes  ?Adequate lighting in your home to reduce risk of falls? Yes  ? ?ASSISTIVE DEVICES UTILIZED TO PREVENT FALLS: ? ?Life alert? Yes  ?Use of a cane, walker or w/c? No  ?Grab bars in the bathroom? No  ?Shower chair or bench in shower? Yes  ?Elevated toilet seat or a handicapped toilet? No  ? ?TIMED UP AND GO: ? ?Was the test performed? No .  ?Length of time to ambulate 10 feet: N/A sec.  ? ?Gait steady and fast without use of assistive device ? ?Cognitive Function: ?  ?  ?  ? ?Immunizations ?Immunization History  ?Administered Date(s) Administered  ? Influenza-Unspecified 07/09/2001, 08/03/2004, 06/19/2006, 07/06/2007, 05/27/2008  ? PFIZER(Purple Top)SARS-COV-2 Vaccination 11/21/2019, 12/13/2019  ? Pneumococcal-Unspecified 12/21/2010  ? ? ?TDAP status: Up to date ? ?Flu Vaccine status: Up to date ? ?Pneumococcal vaccine status: Declined,  Education has been provided regarding the importance of this vaccine but patient still declined. Advised may receive this vaccine at local pharmacy or Health Dept. Aware to provide a copy of the vaccination record if obtained from local pharmacy or Health Dept. Verbalized acceptance and understanding.  ? ?Covid-19 vaccine status: Completed vaccines ? ?Qualifies for Shingles Vaccine? Yes   ?Zostavax completed No   ?Shingrix Completed?: No.    Education has been provided regarding the importance of this vaccine. Patient has been advised to call insurance company to determine out of pocket expense if they have not yet received this vaccine. Advised may also receive vaccine at local pharmacy or Health Dept. Verbalized acceptance and understanding. ? ?Screening Tests ?Health Maintenance  ?Topic Date Due  ? Pneumonia Vaccine 42+ Years old (1 - PCV) 12/03/1951  ? Hepatitis C Screening  Never done  ? Zoster Vaccines- Shingrix (1 of 2) Never done  ? COVID-19 Vaccine (3 - Booster  for Pfizer series) 02/07/2020  ? TETANUS/TDAP  11/12/2022 (Originally 12/03/1964)  ? INFLUENZA VACCINE  04/19/2022  ? HPV VACCINES  Aged Out  ? ? ?Health Maintenance ? ?Health Maintenance Due  ?Topic Date Due  ? Pneumonia Vaccine 69+ Years old (1 - PCV) 12/03/1951  ? Hepatitis C Screening  Never done  ? Zoster Vaccines- Shingrix (1 of 2) Never done  ? COVID-19 Vaccine (3 - Booster for Pfizer series) 02/07/2020  ? ? ?Colorectal cancer screening: Referral to GI placed  Cologuard ordered. Pt aware the office will call re: appt. ? ?Lung Cancer Screening: (Low Dose CT Chest recommended if Age 7-80 years, 30 pack-year currently smoking OR have quit w/in 15years.) does not qualify.  ? ?Lung Cancer Screening Referral: N/A ? ?Additional Screening: ? ?Hepatitis C Screening: does qualify; Completed 01/19/2022 ? ?Vision Screening: Recommended annual ophthalmology exams for early detection of glaucoma and other disorders of the eye. ?Is the patient up to date with their annual eye exam?  Yes  ?Who is the provider or what is the name of the office in which the patient attends annual eye exams? [facility]  ?If pt is not established with a provider, would they like to be referred to a provider to establish care? No .  ? ?Dental Screening: Recommended annual dental exams for proper oral hygiene ? ?Community Resource Referral / Chronic Care Management: ?CRR required this visit?  No  ? ?CCM required this visit?  No  ? ? ?  ?Plan:  ?  ? ?I have personally reviewed and noted the following in the patient?s chart:  ? ?Medical and social history ?Use of alcohol, tobacco or illicit drugs  ?Current medications and supplements including opioid prescriptions. Patient is not currently taking opioid prescriptions. ?Functional ability and status ?Nutritional status ?Physical activity ?Advanced directives ?List of other physicians ?Hospitalizations, surgeries, and ER visits in previous 12 months ?Vitals ?Screenings to include cognitive,  depression, and falls ?Referrals and appointments ? ?In addition, I have reviewed and discussed with patient certain preventive protocols, quality metrics, and best practice recommendations. A written persona

## 2022-01-20 LAB — LIPID PANEL
Chol/HDL Ratio: 4.1 ratio (ref 0.0–5.0)
Cholesterol, Total: 206 mg/dL — ABNORMAL HIGH (ref 100–199)
HDL: 50 mg/dL (ref >39)
LDL Chol Calc (NIH): 144 mg/dL — ABNORMAL HIGH (ref 0–99)
Triglycerides: 68 mg/dL (ref 0–149)
VLDL Cholesterol Cal: 12 mg/dL (ref 5–40)

## 2022-01-20 LAB — HEPATIC FUNCTION PANEL
ALT: 18 IU/L (ref 0–44)
AST: 26 IU/L (ref 0–40)
Albumin: 4.7 g/dL (ref 3.7–4.7)
Alkaline Phosphatase: 73 IU/L (ref 44–121)
Bilirubin Total: 1.5 mg/dL — ABNORMAL HIGH (ref 0.0–1.2)
Bilirubin, Direct: 0.27 mg/dL (ref 0.00–0.40)
Total Protein: 7.7 g/dL (ref 6.0–8.5)

## 2022-01-20 LAB — HEMOGLOBIN A1C
Est. average glucose Bld gHb Est-mCnc: 126 mg/dL
Hgb A1c MFr Bld: 6 % — ABNORMAL HIGH (ref 4.8–5.6)

## 2022-01-20 LAB — TSH: TSH: 1.13 u[IU]/mL (ref 0.450–4.500)

## 2022-01-20 LAB — HEPATITIS C ANTIBODY: Hep C Virus Ab: NONREACTIVE

## 2022-01-20 NOTE — Progress Notes (Signed)
-    Liver function normal. ?-  Thyroid function normal.  ?-  Hepatitis C negative.  ? ?The following abnormalities are noted:   ?-  Cholesterol higher than expected. High cholesterol may increase risk of heart attack and/or stroke. Consider eating more fruits, vegetables, and lean baked meats such as chicken or fish. Moderate intensity exercise at least 150 minutes as tolerated per week may help as well.  ?-  Hemoglobin A1c is consistent with prediabetes. Practice healthy eating habits of fresh fruit and vegetables, lean baked meats such as chicken, fish, and [place]; limit breads, rice, pastas, and desserts; practice regular aerobic exercise (at least 150 minutes a week as tolerated). ? ?All other values are normal, stable or within acceptable limits. ? ?Medication changes / Follow up labs / Other changes or recommendations:   ?-  Continue Atorvastatin for cholesterol maintenance.  ?-  No medication needed as of present for prediabetes. Encouraged to recheck in 6 months.  ? ?Rema Fendt, NP 01/20/2022 11:27 AM  ?

## 2022-01-24 ENCOUNTER — Telehealth: Payer: Self-pay

## 2022-01-24 NOTE — Telephone Encounter (Signed)
Pt returned call - Shared provider's not with pt.  ?Pt already exercises 2 hours a day 3 days a week, but will work on dietary changes. ? ?Att to contact pt to advise of lab results, no ans lvm ?If pt returns call please advise of lab results   ? Aaron Fendt, NP  ?01/20/2022 11:30 AM EDT   ?  ?-  Liver function normal. ?-  Thyroid function normal.  ?-  Hepatitis C negative.  ?  ?The following abnormalities are noted:   ?-  Cholesterol higher than expected. High cholesterol may increase risk of heart attack and/or stroke. Consider eating more fruits, vegetables, and lean baked meats such as chicken or fish. Moderate intensity exercise at least 150 minutes as tolerated per week may help as well.  ?-  Hemoglobin A1c is consistent with prediabetes. Practice healthy eating habits of fresh fruit and vegetables, lean baked meats such as chicken, fish, and [place]; limit breads, rice, pastas, and desserts; practice regular aerobic exercise (at least 150 minutes a week as tolerated). ?  ?All other values are normal, stable or within acceptable limits. ?  ?Medication changes / Follow up labs / Other changes or recommendations:   ?-  Continue Atorvastatin for cholesterol maintenance.  ?-  No medication needed as of present for prediabetes. Encouraged to recheck in 6 months.  ?  ?Aaron Fendt, NP 01/20/2022 11:27 AM   ? ?

## 2022-01-31 DIAGNOSIS — Z1211 Encounter for screening for malignant neoplasm of colon: Secondary | ICD-10-CM | POA: Diagnosis not present

## 2022-02-10 LAB — COLOGUARD: COLOGUARD: NEGATIVE

## 2022-02-10 NOTE — Progress Notes (Signed)
Cologuard negative 

## 2022-05-23 IMAGING — CR DG ANKLE 2V *R*
2 series · 2 of 2 positions shown · non-contrast
Comparison: None.

CLINICAL DATA: Right ankle pain after fall. Pain and swelling after
syncopal episode this morning.

EXAM:
RIGHT ANKLE - 2 VIEW

[ankle ap]
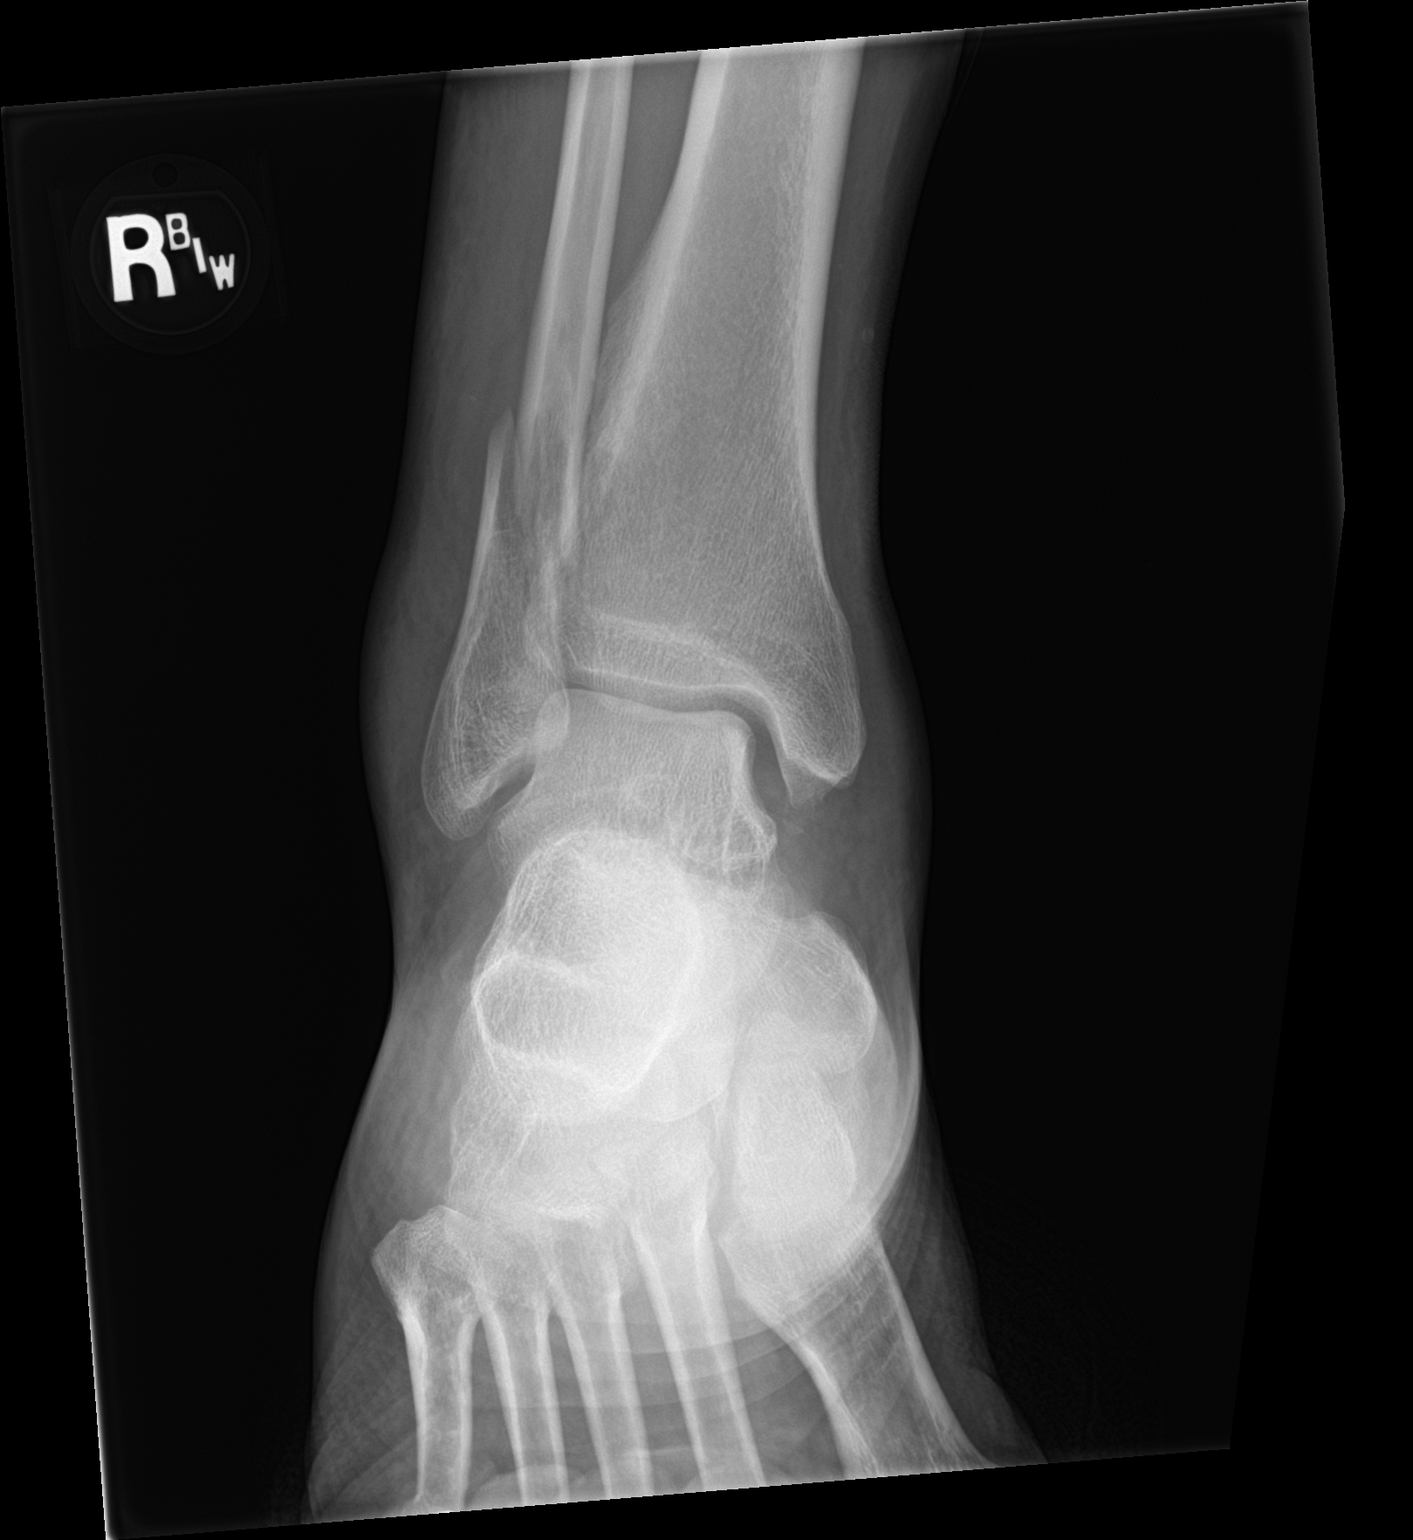

[ankle lat]
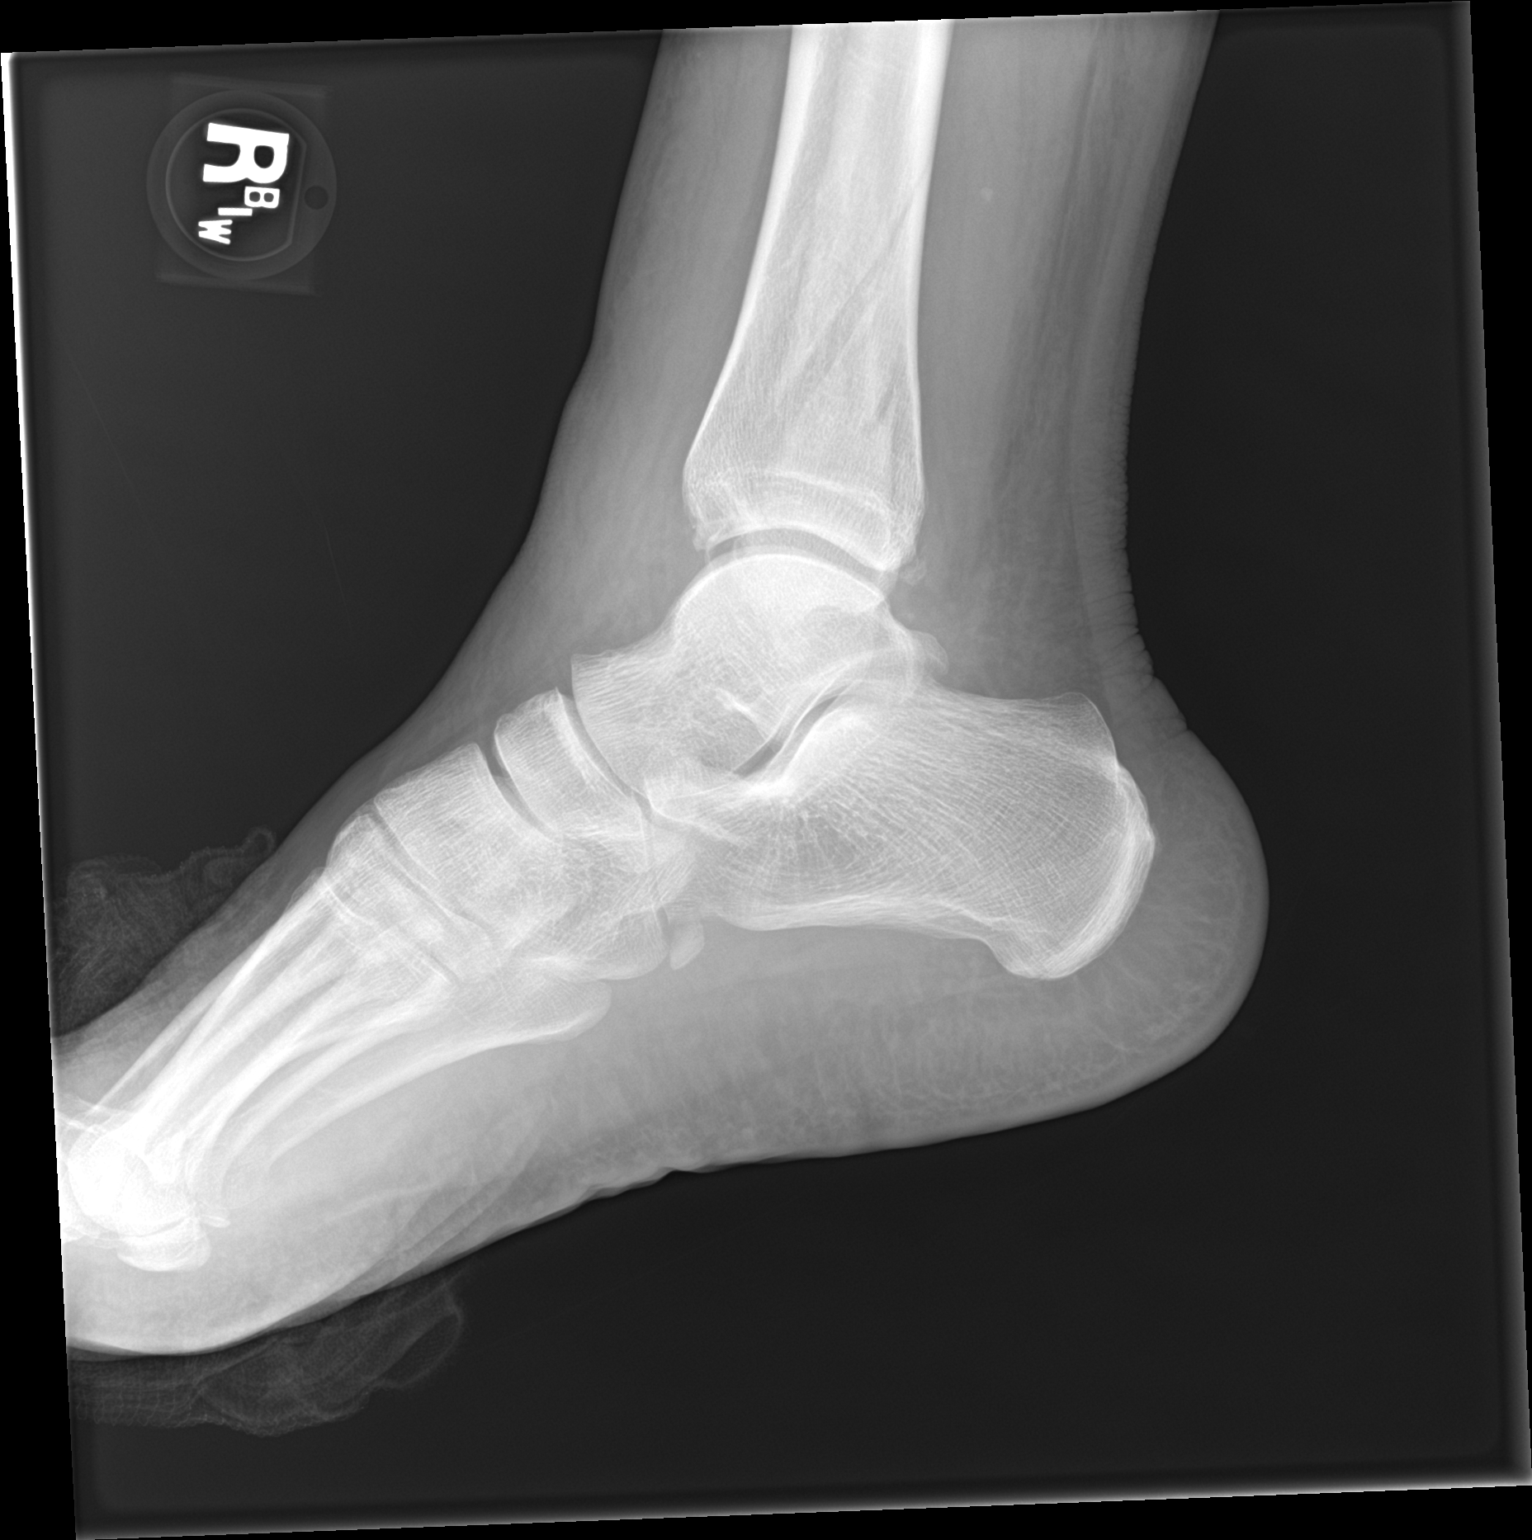

[2 of 2 positions shown; findings below may reference images not displayed]

FINDINGS: Oblique mildly displaced distal fibular fracture just proximal to
the ankle mortise. Possible small avulsion fracture from the medial
malleolus which is likely acute. Equivocal widening of the medial
clear space of 5 mm. Generalized soft tissue edema with possible
joint effusion.
IMPRESSION: 1. Oblique mildly displaced distal fibular fracture just proximal to
the ankle mortise. Possible small avulsion fracture from the medial
malleolus.
2. Equivocal widening of the medial clear space of 5 mm.

## 2022-05-23 IMAGING — CT CT HEAD W/O CM
4 series · 17 of 47 positions shown, 19 images · non-contrast
Comparison: None.

CLINICAL DATA: Syncope, fall

EXAM:
CT HEAD WITHOUT CONTRAST
TECHNIQUE: Contiguous axial images were obtained from the base of the skull
through the vertex without intravenous contrast.

[Series 3: head wo · axial · 0.46mm/px · z∈[+1100,+1220]mm · 7 of 34 slices shown, 9 images]
[im 5/34  brain]
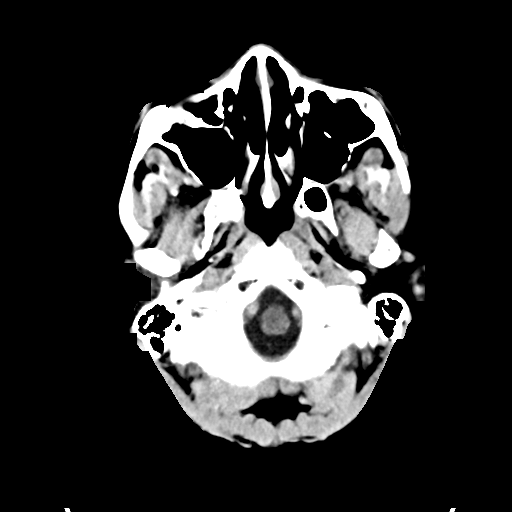
[im 5/34  bone]
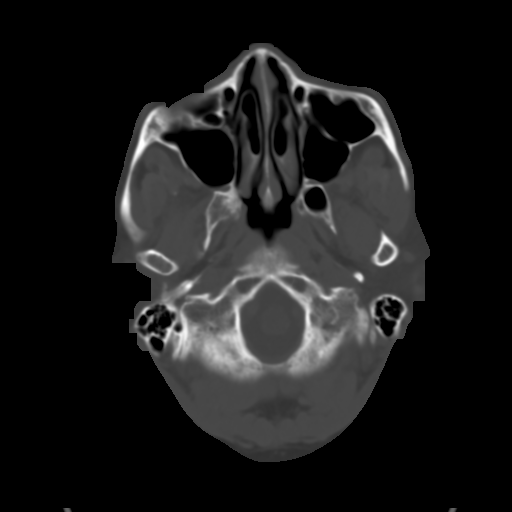
[im 9/34  brain]
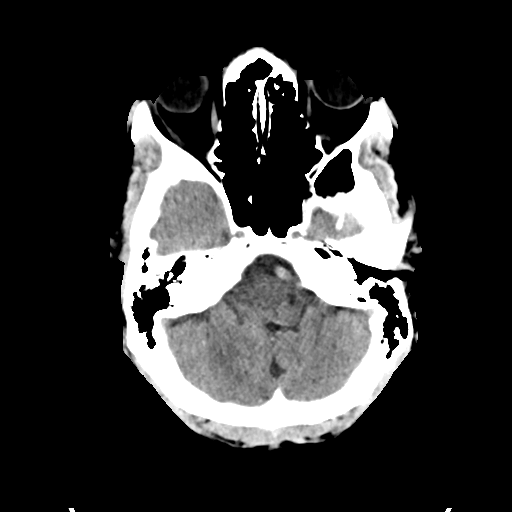
[im 13/34  brain]
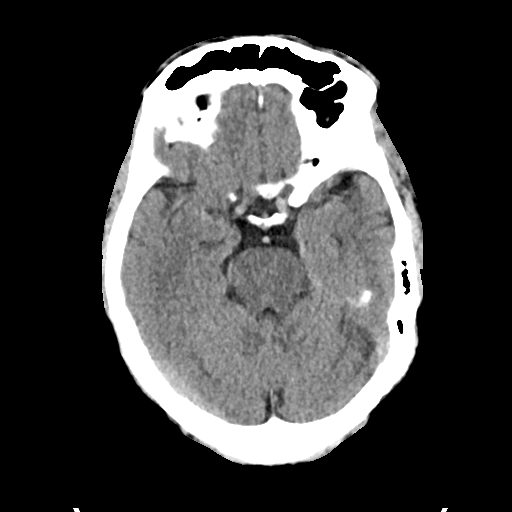
[im 17/34  brain]
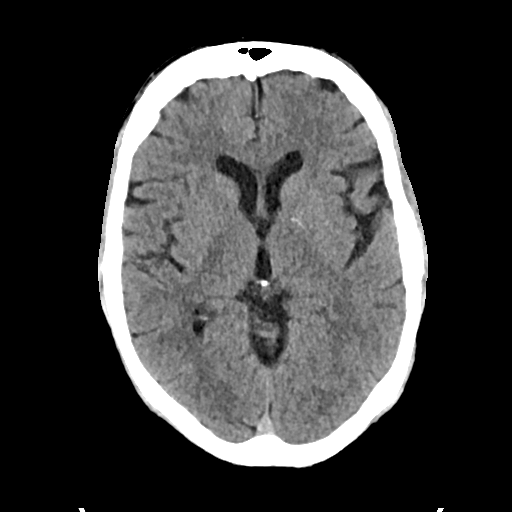
[im 21/34  brain]
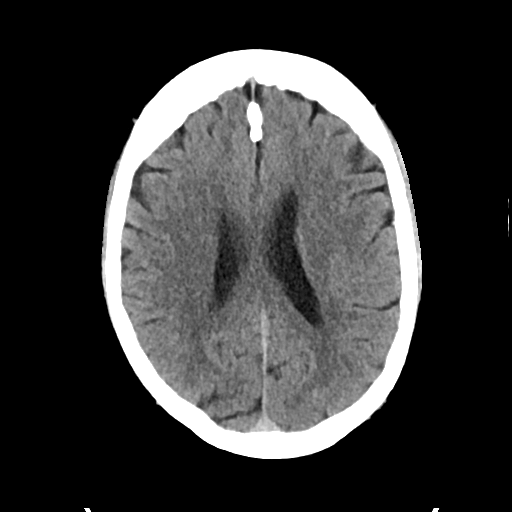
[im 21/34  bone]
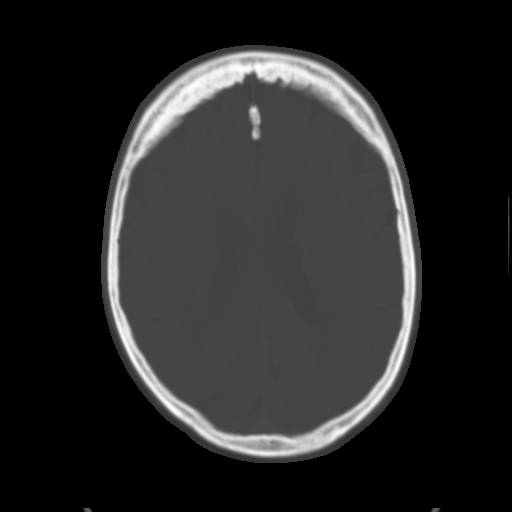
[im 25/34  brain]
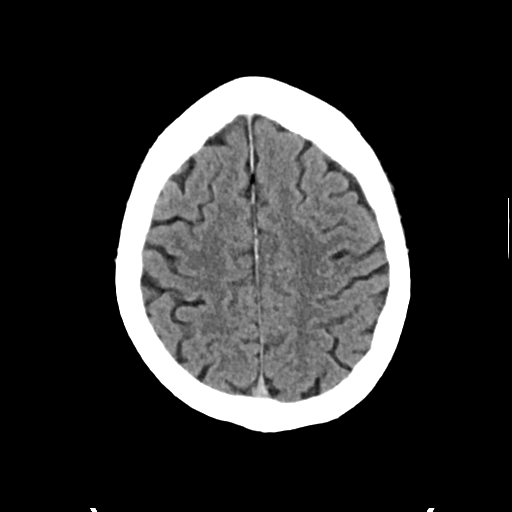
[im 29/34  brain]
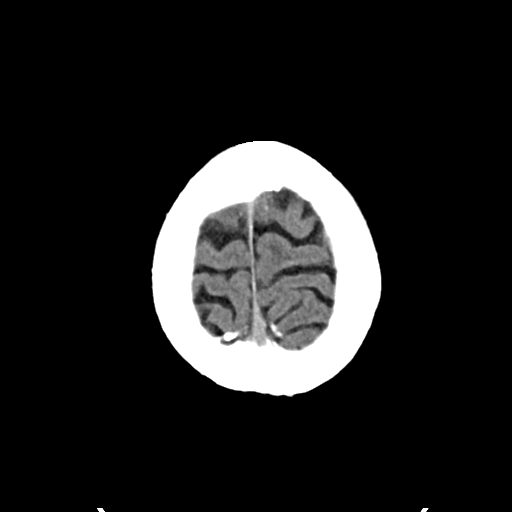

[Series 4: head bone · axial · 0.46mm/px · z∈[+1096,+1154]mm · 4 of 84 slices shown]
[im 9/84  bone]
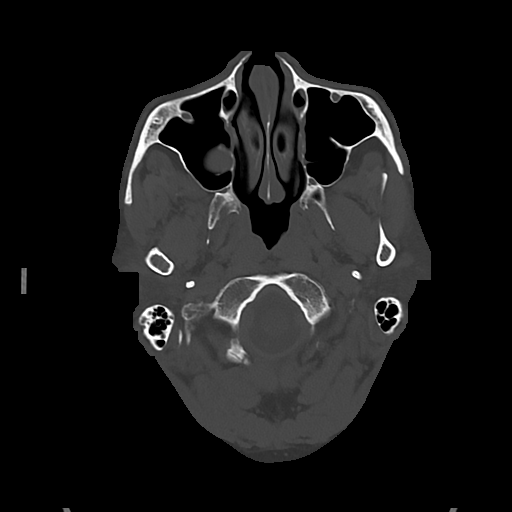
[im 17/84  bone]
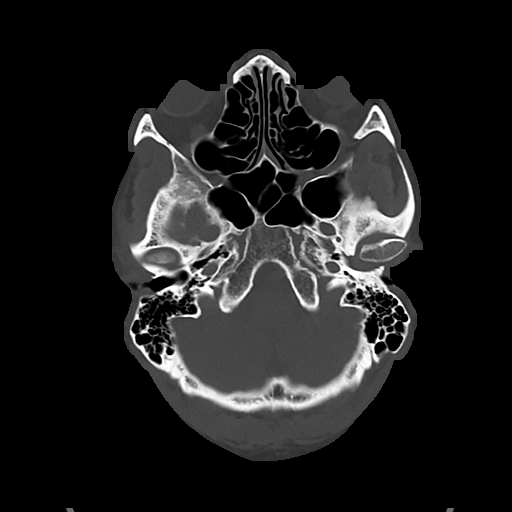
[im 25/84  bone]
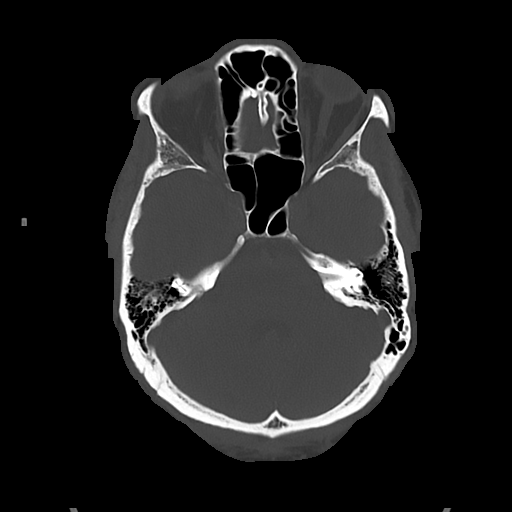
[im 38/84  bone]
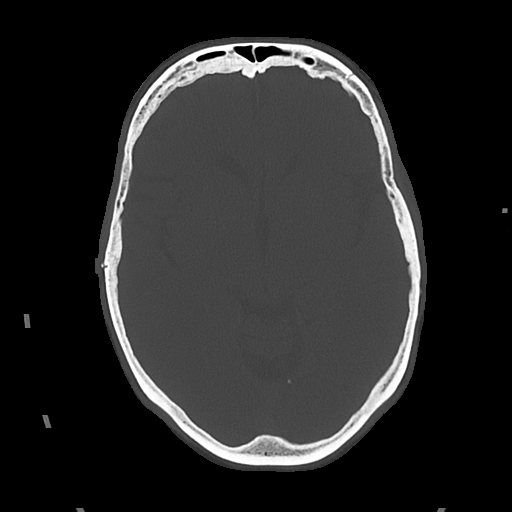

[Series 5: cor soft · coronal · 0.34mm/px · 3 of 72 slices shown]
[im 24/72  brain]
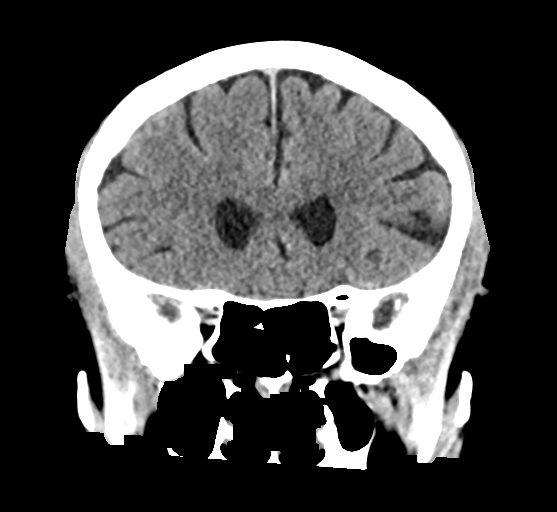
[im 32/72  brain]
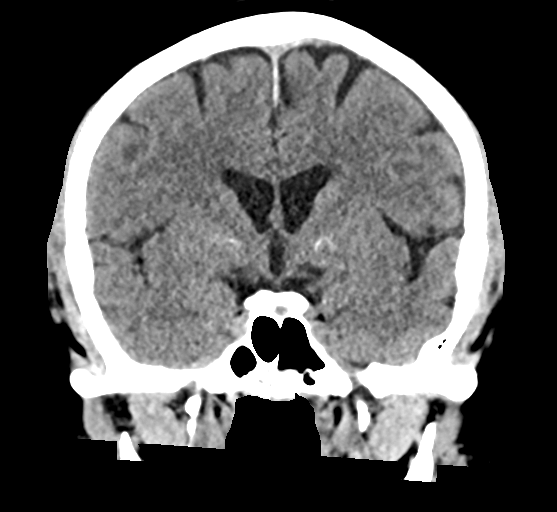
[im 40/72  brain]
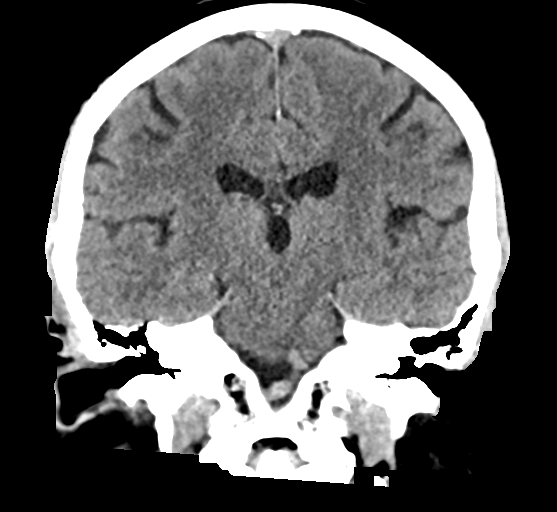

[Series 6: sag soft · sagittal · 0.34mm/px · 3 of 63 slices shown]
[im 21/63  brain]
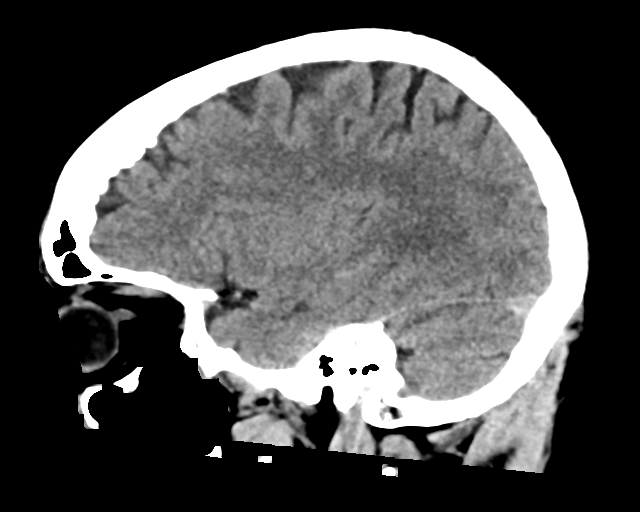
[im 32/63  brain]
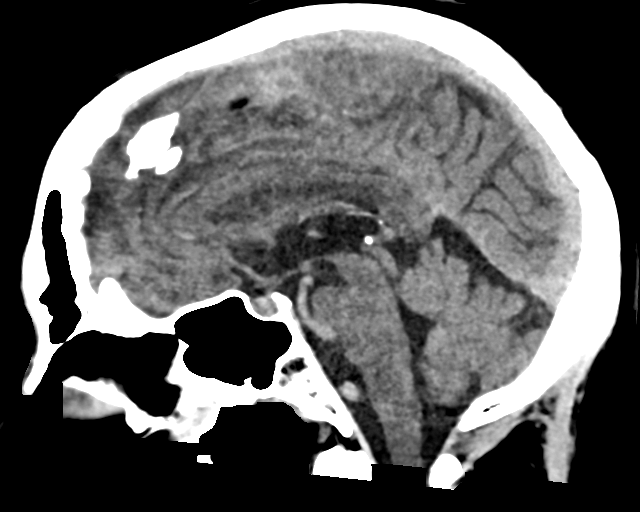
[im 42/63  brain]
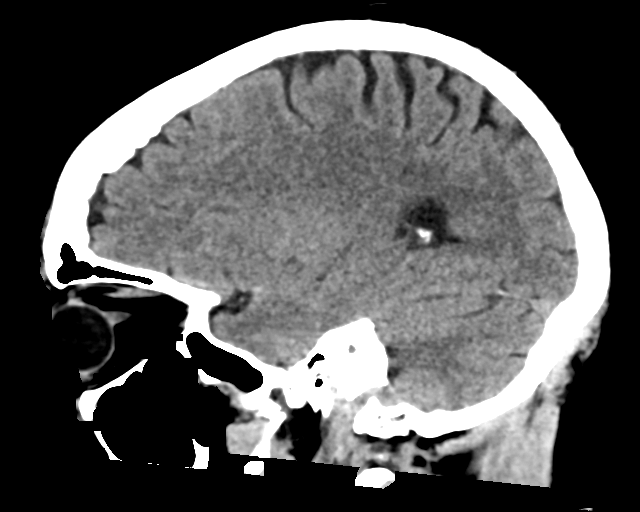

[17 of 47 positions shown; findings below may reference images not displayed]

FINDINGS: Brain: No evidence of acute infarction, hemorrhage, hydrocephalus,
extra-axial collection or mass lesion/mass effect.

Vascular: Negative for hyperdense vessel

Skull: Negative

Sinuses/Orbits: Cyst in the right maxillary sinus otherwise clear
sinuses. Negative orbit

Other: None
IMPRESSION: Negative CT head

## 2023-01-26 ENCOUNTER — Telehealth: Payer: Self-pay | Admitting: Family

## 2023-01-26 NOTE — Telephone Encounter (Signed)
Called patient to schedule Medicare Annual Wellness Visit (AWV). Left message for patient to call back and schedule Medicare Annual Wellness Visit (AWV).  Last date of AWV: 01/19/22 Please transfer to Ja Ohman @ [phone] to schedule AWV   Thank you ,  Rudell Cobb AWV direct phone # [phone]

## 2023-01-27 ENCOUNTER — Telehealth: Payer: Self-pay | Admitting: Family

## 2023-01-27 NOTE — Telephone Encounter (Signed)
Contacted Aubry Spires to schedule their annual wellness visit. Appointment made for 02/02/23.  Rudell Cobb AWV direct phone # [phone]

## 2023-02-02 ENCOUNTER — Ambulatory Visit (INDEPENDENT_AMBULATORY_CARE_PROVIDER_SITE_OTHER): Payer: Medicare HMO

## 2023-02-02 VITALS — Ht 70.0 in | Wt 190.0 lb

## 2023-02-02 DIAGNOSIS — Z0000 Encounter for general adult medical examination without abnormal findings: Secondary | ICD-10-CM | POA: Diagnosis not present

## 2023-02-02 NOTE — Progress Notes (Addendum)
I connected with  [name] on 02/02/23 by a audio enabled telemedicine application and verified that I am speaking with the correct person using two identifiers.  Patient Location: Home  Provider Location: Office/Clinic  I discussed the limitations of evaluation and management by telemedicine. The patient expressed understanding and agreed to proceed.  Subjective:   Aaron Gregory is a 77 y.o. male who presents for Medicare Annual/Subsequent preventive examination.  Review of Systems     Cardiac Risk Factors include: advanced age (>87men, >54 women);dyslipidemia;hypertension;male gender     Objective:    Today's Vitals   02/02/23 1127  Weight: 190 lb (86.2 kg)  Height: 5\' 10"  (1.778 m)   Body mass index is 27.26 kg/m.     02/02/2023   11:32 AM 04/28/2020   11:28 AM  Advanced Directives  Does Patient Have a Medical Advance Directive? No No  Would patient like information on creating a medical advance directive?  No - Patient declined    Current Medications (verified) Outpatient Encounter Medications as of 02/02/2023  Medication Sig   amLODipine (NORVASC) 5 MG tablet Take 1 tablet (5 mg total) by mouth daily.   atorvastatin (LIPITOR) 80 MG tablet Take 1 tablet (80 mg total) by mouth daily.   lisinopril-hydrochlorothiazide (ZESTORETIC) 20-25 MG tablet Take 1 tablet by mouth daily.   tadalafil (CIALIS) 20 MG tablet Take 20 mg by mouth daily as needed for erectile dysfunction. (Patient not taking: Reported on 02/02/2023)   VITAMIN D, CHOLECALCIFEROL, PO Take by mouth. DAILY (Patient not taking: Reported on 02/02/2023)   No facility-administered encounter medications on file as of 02/02/2023.    Allergies (verified) Patient has no known allergies.   History: Past Medical History:  Diagnosis Date   Arthritis    lower back bulging discs x 2   Closed right ankle fracture 04/15/2020   fell down has cast on   Headache    Hepatitis    heaptitis a 1970's   Hyperlipemia     Hypertension    Past Surgical History:  Procedure Laterality Date   MULTIPLE TOOTH EXTRACTIONS  2017   ORIF ANKLE FRACTURE Right 04/28/2020   Procedure: OPEN REDUCTION INTERNAL FIXATION (ORIF) ANKLE FRACTURE;  Surgeon: Sheral Apley, MD;  Location: [facility];  Service: Orthopedics;  Laterality: Right;   PERIRECTAL ABSCESS SURGERY  1970'S   History reviewed. No pertinent family history. Social History   Socioeconomic History   Marital status: Married    Spouse name: Not on file   Number of children: Not on file   Years of education: Not on file   Highest education level: Not on file  Occupational History   Not on file  Tobacco Use   Smoking status: Former    Packs/day: 0.25    Years: 2.00    Additional pack years: 0.00    Total pack years: 0.50    Types: Cigarettes    Quit date: 06/19/1968    Years since quitting: 54.6   Smokeless tobacco: Never  Vaping Use   Vaping Use: Never used  Substance and Sexual Activity   Alcohol use: Yes    Comment: occ 2 drinks per week   Drug use: Never   Sexual activity: Yes  Other Topics Concern   Not on file  Social History Narrative   Not on file   Social Determinants of Health   Financial Resource Strain: Low Risk  (02/02/2023)   Overall Financial Resource Strain (CARDIA)    Difficulty of  Paying Living Expenses: Not hard at all  Food Insecurity: No Food Insecurity (02/02/2023)   Hunger Vital Sign    Worried About Running Out of Food in the Last Year: Never true    Ran Out of Food in the Last Year: Never true  Transportation Needs: No Transportation Needs (02/02/2023)   PRAPARE - Administrator, Civil Service (Medical): No    Lack of Transportation (Non-Medical): No  Physical Activity: Inactive (02/02/2023)   Exercise Vital Sign    Days of Exercise per Week: 0 days    Minutes of Exercise per Session: 0 min  Stress: No Stress Concern Present (02/02/2023)   [employer] of Occupational Health -  Occupational Stress Questionnaire    Feeling of Stress : Not at all  Social Connections: Not on file    Tobacco Counseling Counseling given: Not Answered   Clinical Intake:  Pre-visit preparation completed: Yes  Pain : No/denies pain     Nutritional Status: BMI 25 -29 Overweight Nutritional Risks: None Diabetes: No  How often do you need to have someone help you when you read instructions, pamphlets, or other written materials from your doctor or pharmacy?: 1 - Never  Diabetic? no  Interpreter Needed?: No  Information entered by :: NAllen LPN   Activities of Daily Living    02/02/2023   11:33 AM  In your present state of health, do you have any difficulty performing the following activities:  Hearing? 1  Vision? 0  Difficulty concentrating or making decisions? 0  Walking or climbing stairs? 0  Dressing or bathing? 0  Doing errands, shopping? 0  Preparing Food and eating ? N  Using the Toilet? N  In the past six months, have you accidently leaked urine? N  Do you have problems with loss of bowel control? N  Managing your Medications? N  Managing your Finances? N  Housekeeping or managing your Housekeeping? N    Patient Care Team: Rema Fendt, NP as PCP - General (Nurse Practitioner)  Indicate any recent Medical Services you may have received from other than [facility] providers in the past year (date may be approximate).     Assessment:   This is a routine wellness examination for Aaron Gregory.  Hearing/Vision screen Vision Screening - Comments:: No regular eye exams, [facility]  Dietary issues and exercise activities discussed: Current Exercise Habits: The patient does not participate in regular exercise at present   Goals Addressed             This Visit's Progress    Patient Stated       02/02/2023, stay healthy       Depression Screen    02/02/2023   11:33 AM 11/12/2021    8:14 AM 09/16/2021   10:00 AM  PHQ 2/9 Scores  PHQ - 2 Score 0 0 0     Fall Risk    02/02/2023   11:32 AM 09/16/2021   10:00 AM  Fall Risk   Falls in the past year? 0 0  Number falls in past yr: 0 0  Injury with Fall? 0 0  Risk for fall due to : Medication side effect   Follow up Falls prevention discussed;Education provided;Falls evaluation completed Falls evaluation completed    FALL RISK PREVENTION PERTAINING TO THE HOME:  Any stairs in or around the home? Yes  If so, are there any without handrails? No  Home free of loose throw rugs in walkways, pet beds, electrical cords, etc? Yes  Adequate lighting in your home to reduce risk of falls? Yes   ASSISTIVE DEVICES UTILIZED TO PREVENT FALLS:  Life alert? No  Use of a cane, walker or w/c? No  Grab bars in the bathroom? Yes  Shower chair or bench in shower? Yes  Elevated toilet seat or a handicapped toilet? No   TIMED UP AND GO:  Was the test performed? No .      Cognitive Function:        02/02/2023   11:35 AM  6CIT Screen  What Year? 0 points  What month? 0 points  What time? 0 points  Count back from 20 0 points  Months in reverse 0 points  Repeat phrase 2 points  Total Score 2 points    Immunizations Immunization History  Administered Date(s) Administered   Influenza-Unspecified 07/09/2001, 08/03/2004, 06/19/2006, 07/06/2007, 05/27/2008   PFIZER(Purple Top)SARS-COV-2 Vaccination 11/21/2019, 12/13/2019   Pneumococcal-Unspecified 12/21/2010    TDAP status: Due, Education has been provided regarding the importance of this vaccine. Advised may receive this vaccine at local pharmacy or Health Dept. Aware to provide a copy of the vaccination record if obtained from local pharmacy or Health Dept. Verbalized acceptance and understanding.  Flu Vaccine status: Up to date  Pneumococcal vaccine status: Declined,  Education has been provided regarding the importance of this vaccine but patient still declined. Advised may receive this vaccine at local pharmacy or Health Dept. Aware to  provide a copy of the vaccination record if obtained from local pharmacy or Health Dept. Verbalized acceptance and understanding.   Covid-19 vaccine status: Information provided on how to obtain vaccines.   Qualifies for Shingles Vaccine? Yes   Zostavax completed No   Shingrix Completed?: No.    Education has been provided regarding the importance of this vaccine. Patient has been advised to call insurance company to determine out of pocket expense if they have not yet received this vaccine. Advised may also receive vaccine at local pharmacy or Health Dept. Verbalized acceptance and understanding.  Screening Tests Health Maintenance  Topic Date Due   DTaP/Tdap/Td (1 - Tdap) Never done   Zoster Vaccines- Shingrix (1 of 2) Never done   Pneumonia Vaccine 5+ Years old (1 of 1 - PCV) 12/02/2009   COVID-19 Vaccine (3 - 2023-24 season) 05/20/2022   Medicare Annual Wellness (AWV)  01/20/2023   INFLUENZA VACCINE  04/20/2023   Hepatitis C Screening  Completed   HPV VACCINES  Aged Out    Health Maintenance  Health Maintenance Due  Topic Date Due   DTaP/Tdap/Td (1 - Tdap) Never done   Zoster Vaccines- Shingrix (1 of 2) Never done   Pneumonia Vaccine 38+ Years old (1 of 1 - PCV) 12/02/2009   COVID-19 Vaccine (3 - 2023-24 season) 05/20/2022   Medicare Annual Wellness (AWV)  01/20/2023    Colorectal cancer screening: No longer required.   Lung Cancer Screening: (Low Dose CT Chest recommended if Age 15-80 years, 30 pack-year currently smoking OR have quit w/in 15years.) does not qualify.   Lung Cancer Screening Referral: no  Additional Screening:  Hepatitis C Screening: does qualify; Completed 01/19/2022  Vision Screening: Recommended annual ophthalmology exams for early detection of glaucoma and other disorders of the eye. Is the patient up to date with their annual eye exam?  Yes  Who is the provider or what is the name of the office in which the patient attends annual eye exams?  [facility] If pt is not established with a provider, would they  like to be referred to a provider to establish care? No .   Dental Screening: Recommended annual dental exams for proper oral hygiene  Community Resource Referral / Chronic Care Management: CRR required this visit?  No   CCM required this visit?  No      Plan:     I have personally reviewed and noted the following in the patient's chart:   Medical and social history Use of alcohol, tobacco or illicit drugs  Current medications and supplements including opioid prescriptions. Patient is not currently taking opioid prescriptions. Functional ability and status Nutritional status Physical activity Advanced directives List of other physicians Hospitalizations, surgeries, and ER visits in previous 12 months Vitals Screenings to include cognitive, depression, and falls Referrals and appointments  In addition, I have reviewed and discussed with patient certain preventive protocols, quality metrics, and best practice recommendations. A written personalized care plan for preventive services as well as general preventive health recommendations were provided to patient.     Barb Merino, LPN   1/61/0960   Nurse Notes: none  Due to this being a virtual visit, the after visit summary with patients personalized plan was offered to patient via mail or my-chart. to pick up at office at next visit

## 2023-02-02 NOTE — Patient Instructions (Signed)
Aaron Gregory , Thank you for taking time to come for your Medicare Wellness Visit. I appreciate your ongoing commitment to your health goals. Please review the following plan we discussed and let me know if I can assist you in the future.   These are the goals we discussed:  Goals      Patient Stated     02/02/2023, stay healthy        This is a list of the screening recommended for you and due dates:  Health Maintenance  Topic Date Due   DTaP/Tdap/Td vaccine (1 - Tdap) Never done   Zoster (Shingles) Vaccine (1 of 2) Never done   Pneumonia Vaccine (1 of 1 - PCV) 12/02/2009   COVID-19 Vaccine (3 - 2023-24 season) 05/20/2022   Flu Shot  04/20/2023   Medicare Annual Wellness Visit  02/02/2024   Hepatitis C Screening: USPSTF Recommendation to screen - Ages 18-79 yo.  Completed   HPV Vaccine  Aged Out    Advanced directives: Advance directive discussed with you today.   Conditions/risks identified: none  Next appointment: Follow up in one year for your annual wellness visit.   Preventive Care 3 Years and Older, Male  Preventive care refers to lifestyle choices and visits with your health care provider that can promote health and wellness. What does preventive care include? A yearly physical exam. This is also called an annual well check. Dental exams once or twice a year. Routine eye exams. Ask your health care provider how often you should have your eyes checked. Personal lifestyle choices, including: Daily care of your teeth and gums. Regular physical activity. Eating a healthy diet. Avoiding tobacco and drug use. Limiting alcohol use. Practicing safe sex. Taking low doses of aspirin every day. Taking vitamin and mineral supplements as recommended by your health care provider. What happens during an annual well check? The services and screenings done by your health care provider during your annual well check will depend on your age, overall health, lifestyle risk factors,  and family history of disease. Counseling  Your health care provider may ask you questions about your: Alcohol use. Tobacco use. Drug use. Emotional well-being. Home and relationship well-being. Sexual activity. Eating habits. History of falls. Memory and ability to understand (cognition). Work and work Astronomer. Screening  You may have the following tests or measurements: Height, weight, and BMI. Blood pressure. Lipid and cholesterol levels. These may be checked every 5 years, or more frequently if you are over 88 years old. Skin check. Lung cancer screening. You may have this screening every year starting at age 25 if you have a 30-pack-year history of smoking and currently smoke or have quit within the past 15 years. Fecal occult blood test (FOBT) of the stool. You may have this test every year starting at age 57. Flexible sigmoidoscopy or colonoscopy. You may have a sigmoidoscopy every 5 years or a colonoscopy every 10 years starting at age 68. Prostate cancer screening. Recommendations will vary depending on your family history and other risks. Hepatitis C blood test. Hepatitis B blood test. Sexually transmitted disease (STD) testing. Diabetes screening. This is done by checking your blood sugar (glucose) after you have not eaten for a while (fasting). You may have this done every 1-3 years. Abdominal aortic aneurysm (AAA) screening. You may need this if you are a current or former smoker. Osteoporosis. You may be screened starting at age 42 if you are at high risk. Talk with your health care provider about  your test results, treatment options, and if necessary, the need for more tests. Vaccines  Your health care provider may recommend certain vaccines, such as: Influenza vaccine. This is recommended every year. Tetanus, diphtheria, and acellular pertussis (Tdap, Td) vaccine. You may need a Td booster every 10 years. Zoster vaccine. You may need this after age  31. Pneumococcal 13-valent conjugate (PCV13) vaccine. One dose is recommended after age 45. Pneumococcal polysaccharide (PPSV23) vaccine. One dose is recommended after age 35. Talk to your health care provider about which screenings and vaccines you need and how often you need them. This information is not intended to replace advice given to you by your health care provider. Make sure you discuss any questions you have with your health care provider. Document Released: 10/02/2015 Document Revised: 05/25/2016 Document Reviewed: 07/07/2015 Elsevier Interactive Patient Education  2017 [place] Prevention in the Home Falls can cause injuries. They can happen to people of all ages. There are many things you can do to make your home safe and to help prevent falls. What can I do on the outside of my home? Regularly fix the edges of walkways and driveways and fix any cracks. Remove anything that might make you trip as you walk through a door, such as a raised step or threshold. Trim any bushes or trees on the path to your home. Use bright outdoor lighting. Clear any walking paths of anything that might make someone trip, such as rocks or tools. Regularly check to see if handrails are loose or broken. Make sure that both sides of any steps have handrails. Any raised decks and porches should have guardrails on the edges. Have any leaves, snow, or ice cleared regularly. Use sand or salt on walking paths during winter. Clean up any spills in your garage right away. This includes oil or grease spills. What can I do in the bathroom? Use night lights. Install grab bars by the toilet and in the tub and shower. Do not use towel bars as grab bars. Use non-skid mats or decals in the tub or shower. If you need to sit down in the shower, use a plastic, non-slip stool. Keep the floor dry. Clean up any water that spills on the floor as soon as it happens. Remove soap buildup in the tub or shower  regularly. Attach bath mats securely with double-sided non-slip rug tape. Do not have throw rugs and other things on the floor that can make you trip. What can I do in the bedroom? Use night lights. Make sure that you have a light by your bed that is easy to reach. Do not use any sheets or blankets that are too big for your bed. They should not hang down onto the floor. Have a firm chair that has side arms. You can use this for support while you get dressed. Do not have throw rugs and other things on the floor that can make you trip. What can I do in the kitchen? Clean up any spills right away. Avoid walking on wet floors. Keep items that you use a lot in easy-to-reach places. If you need to reach something above you, use a strong step stool that has a grab bar. Keep electrical cords out of the way. Do not use floor polish or wax that makes floors slippery. If you must use wax, use non-skid floor wax. Do not have throw rugs and other things on the floor that can make you trip. What can I do with  my stairs? Do not leave any items on the stairs. Make sure that there are handrails on both sides of the stairs and use them. Fix handrails that are broken or loose. Make sure that handrails are as long as the stairways. Check any carpeting to make sure that it is firmly attached to the stairs. Fix any carpet that is loose or worn. Avoid having throw rugs at the top or bottom of the stairs. If you do have throw rugs, attach them to the floor with carpet tape. Make sure that you have a light switch at the top of the stairs and the bottom of the stairs. If you do not have them, ask someone to add them for you. What else can I do to help prevent falls? Wear shoes that: Do not have high heels. Have rubber bottoms. Are comfortable and fit you well. Are closed at the toe. Do not wear sandals. If you use a stepladder: Make sure that it is fully opened. Do not climb a closed stepladder. Make sure that  both sides of the stepladder are locked into place. Ask someone to hold it for you, if possible. Clearly mark and make sure that you can see: Any grab bars or handrails. First and last steps. Where the edge of each step is. Use tools that help you move around (mobility aids) if they are needed. These include: Canes. Walkers. Scooters. Crutches. Turn on the lights when you go into a dark area. Replace any light bulbs as soon as they burn out. Set up your furniture so you have a clear path. Avoid moving your furniture around. If any of your floors are uneven, fix them. If there are any pets around you, be aware of where they are. Review your medicines with your doctor. Some medicines can make you feel dizzy. This can increase your chance of falling. Ask your doctor what other things that you can do to help prevent falls. This information is not intended to replace advice given to you by your health care provider. Make sure you discuss any questions you have with your health care provider. Document Released: 07/02/2009 Document Revised: 02/11/2016 Document Reviewed: 10/10/2014 Elsevier Interactive Patient Education  2017 [employer].

## 2023-02-03 NOTE — Progress Notes (Unsigned)
Patient ID: Aaron Gregory, male    DOB: 07-20-1946  MRN: [id number]  CC: Referral   Subjective: Aaron Gregory is a 77 y.o. male who presents for referral.   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 120 tablet 0   atorvastatin (LIPITOR) 80 MG tablet Take 1 tablet (80 mg total) by mouth daily. 90 tablet 1   lisinopril-hydrochlorothiazide (ZESTORETIC) 20-25 MG tablet Take 1 tablet by mouth daily. 120 tablet 0   tadalafil (CIALIS) 20 MG tablet Take 20 mg by mouth daily as needed for erectile dysfunction. (Patient not taking: Reported on 02/02/2023)     VITAMIN D, CHOLECALCIFEROL, PO Take by mouth. DAILY (Patient not taking: Reported on 02/02/2023)     No current facility-administered medications on file prior to visit.    No Known Allergies  Social History   Socioeconomic History   Marital status: Married    Spouse name: Not on file   Number of children: Not on file   Years of education: Not on file   Highest education level: Not on file  Occupational History   Not on file  Tobacco Use   Smoking status: Former    Packs/day: 0.25    Years: 2.00    Additional pack years: 0.00    Total pack years: 0.50     Types: Cigarettes    Quit date: 06/19/1968    Years since quitting: 54.6   Smokeless tobacco: Never  Vaping Use   Vaping Use: Never used  Substance and Sexual Activity   Alcohol use: Yes    Comment: occ 2 drinks per week   Drug use: Never   Sexual activity: Yes  Other Topics Concern   Not on file  Social History Narrative   Not on file   Social Determinants of Health   Financial Resource Strain: Low Risk  (02/02/2023)   Overall Financial Resource Strain (CARDIA)    Difficulty of Paying Living Expenses: Not hard at all  Food Insecurity: No Food Insecurity (02/02/2023)   Hunger Vital Sign    Worried About Running Out of Food in the Last Year: Never true    Ran Out of Food in the Last Year: Never true  Transportation Needs: No Transportation Needs (02/02/2023)   PRAPARE - Administrator, Civil Service (Medical): No    Lack of Transportation (Non-Medical): No  Physical Activity: Inactive (02/02/2023)   Exercise Vital Sign    Days of Exercise per Week: 0 days    Minutes of Exercise per Session: 0 min  Stress: No Stress Concern Present (02/02/2023)   [employer] of Occupational Health - Occupational Stress Questionnaire    Feeling of Stress : Not at all  Social Connections: Not on file  Intimate Partner Violence: Not  on file    No family history on file.  Past Surgical History:  Procedure Laterality Date   MULTIPLE TOOTH EXTRACTIONS  2017   ORIF ANKLE FRACTURE Right 04/28/2020   Procedure: OPEN REDUCTION INTERNAL FIXATION (ORIF) ANKLE FRACTURE;  Surgeon: Sheral Apley, MD;  Location: [facility] [place];  Service: Orthopedics;  Laterality: Right;   PERIRECTAL ABSCESS SURGERY  1970'S    ROS: Review of Systems Negative except as stated above  PHYSICAL EXAM: There were no vitals taken for this visit.  Physical Exam  {male adult master:310786} {male adult master:310785}     Latest Ref Rng & Units 01/19/2022    1:55 PM 11/12/2021   11:39 AM  04/15/2020    2:43 PM  CMP  Glucose 70 - 99 mg/dL  161  096   BUN 8 - 27 mg/dL  15  10   Creatinine 0.45 - 1.27 mg/dL  4.09  8.11   Sodium 914 - 144 mmol/L  142  142   Potassium 3.5 - 5.2 mmol/L  4.4  4.1   Chloride 96 - 106 mmol/L  102  103   CO2 20 - 29 mmol/L  26  30   Calcium 8.6 - 10.2 mg/dL  78.2  9.6   Total Protein 6.0 - 8.5 g/dL 7.7     Total Bilirubin 0.0 - 1.2 mg/dL 1.5     Alkaline Phos 44 - 121 IU/L 73     AST 0 - 40 IU/L 26     ALT 0 - 44 IU/L 18      Lipid Panel     Component Value Date/Time   CHOL 206 (H) 01/19/2022 1355   TRIG 68 01/19/2022 1355   HDL 50 01/19/2022 1355   CHOLHDL 4.1 01/19/2022 1355   LDLCALC 144 (H) 01/19/2022 1355    CBC    Component Value Date/Time   WBC 12.8 (H) 04/15/2020 1443   RBC 4.72 04/15/2020 1443   HGB 14.4 04/15/2020 1443   HCT 44.7 04/15/2020 1443   PLT 209 04/15/2020 1443   MCV 94.7 04/15/2020 1443   MCH 30.5 04/15/2020 1443   MCHC 32.2 04/15/2020 1443   RDW 12.8 04/15/2020 1443    ASSESSMENT AND PLAN:  There are no diagnoses linked to this encounter.   Patient was given the opportunity to ask questions.  Patient verbalized understanding of the plan and was able to repeat key elements of the plan. Patient was given clear instructions to go to Emergency Department or return to medical center if symptoms don't improve, worsen, or new problems develop.The patient verbalized understanding.   No orders of the defined types were placed in this encounter.    Requested Prescriptions    No prescriptions requested or ordered in this encounter    No follow-ups on file.  Rema Fendt, NP

## 2023-02-06 ENCOUNTER — Ambulatory Visit (INDEPENDENT_AMBULATORY_CARE_PROVIDER_SITE_OTHER): Payer: Medicare HMO | Admitting: Family

## 2023-02-06 ENCOUNTER — Encounter: Payer: Self-pay | Admitting: Family

## 2023-02-06 VITALS — BP 149/90 | HR 71 | Wt 193.2 lb

## 2023-02-06 DIAGNOSIS — R7303 Prediabetes: Secondary | ICD-10-CM | POA: Diagnosis not present

## 2023-02-06 DIAGNOSIS — I10 Essential (primary) hypertension: Secondary | ICD-10-CM | POA: Diagnosis not present

## 2023-02-06 DIAGNOSIS — E785 Hyperlipidemia, unspecified: Secondary | ICD-10-CM | POA: Diagnosis not present

## 2023-02-06 DIAGNOSIS — Z0110 Encounter for examination of ears and hearing without abnormal findings: Secondary | ICD-10-CM

## 2023-02-06 MED ORDER — AMLODIPINE BESYLATE 5 MG PO TABS: 5.0000 mg | ORAL_TABLET | Freq: Every day | ORAL | 0 refills | Status: DC

## 2023-02-06 MED ORDER — LISINOPRIL-HYDROCHLOROTHIAZIDE 20-25 MG PO TABS: 1.0000 | ORAL_TABLET | Freq: Every day | ORAL | 0 refills | Status: DC

## 2023-02-06 MED ORDER — ATORVASTATIN CALCIUM 80 MG PO TABS: 80.0000 mg | ORAL_TABLET | Freq: Every day | ORAL | 0 refills | Status: DC

## 2023-02-06 NOTE — Progress Notes (Signed)
Patient here to get a regular check up and medication refills as well.

## 2023-02-07 ENCOUNTER — Other Ambulatory Visit: Payer: Self-pay | Admitting: Family

## 2023-02-07 DIAGNOSIS — Z13228 Encounter for screening for other metabolic disorders: Secondary | ICD-10-CM

## 2023-02-07 LAB — BASIC METABOLIC PANEL
BUN/Creatinine Ratio: 12 (ref 10–24)
BUN: 15 mg/dL (ref 8–27)
CO2: 29 mmol/L (ref 20–29)
Calcium: 10.5 mg/dL — ABNORMAL HIGH (ref 8.6–10.2)
Chloride: 100 mmol/L (ref 96–106)
Creatinine, Ser: 1.29 mg/dL — ABNORMAL HIGH (ref 0.76–1.27)
Glucose: 115 mg/dL — ABNORMAL HIGH (ref 70–99)
Potassium: 4.2 mmol/L (ref 3.5–5.2)
Sodium: 140 mmol/L (ref 134–144)
eGFR: 57 mL/min/{1.73_m2} — ABNORMAL LOW (ref >59)

## 2023-02-07 LAB — LIPID PANEL
Chol/HDL Ratio: 4.2 ratio (ref 0.0–5.0)
Cholesterol, Total: 225 mg/dL — ABNORMAL HIGH (ref 100–199)
HDL: 54 mg/dL (ref >39)
LDL Chol Calc (NIH): 159 mg/dL — ABNORMAL HIGH (ref 0–99)
Triglycerides: 68 mg/dL (ref 0–149)
VLDL Cholesterol Cal: 12 mg/dL (ref 5–40)

## 2023-02-07 LAB — HEMOGLOBIN A1C
Est. average glucose Bld gHb Est-mCnc: 128 mg/dL
Hgb A1c MFr Bld: 6.1 % — ABNORMAL HIGH (ref 4.8–5.6)

## 2023-02-08 ENCOUNTER — Telehealth: Payer: Self-pay

## 2023-02-08 NOTE — Telephone Encounter (Signed)
-----   Message from Rema Fendt, NP sent at 02/07/2023  7:49 AM EDT ----- - Kidney function lower than normal. Please call our office and schedule a lab appointment to recheck in 4 weeks. - Continue Atorvastatin for cholesterol management. - Hemoglobin A1c remaining at prediabetes. Recheck in 6 months. - All other values are normal, stable or within acceptable limits.

## 2023-02-08 NOTE — Telephone Encounter (Signed)
Pt was called and vm was left, Information has been sent to nurse pool.   

## 2023-03-09 ENCOUNTER — Other Ambulatory Visit: Payer: Medicare HMO

## 2023-03-16 ENCOUNTER — Other Ambulatory Visit: Payer: Medicare HMO

## 2023-03-16 DIAGNOSIS — Z13228 Encounter for screening for other metabolic disorders: Secondary | ICD-10-CM | POA: Diagnosis not present

## 2023-03-17 LAB — BASIC METABOLIC PANEL
BUN/Creatinine Ratio: 10 (ref 10–24)
BUN: 13 mg/dL (ref 8–27)
CO2: 24 mmol/L (ref 20–29)
Calcium: 10.2 mg/dL (ref 8.6–10.2)
Chloride: 99 mmol/L (ref 96–106)
Creatinine, Ser: 1.26 mg/dL (ref 0.76–1.27)
Glucose: 125 mg/dL — ABNORMAL HIGH (ref 70–99)
Potassium: 4 mmol/L (ref 3.5–5.2)
Sodium: 141 mmol/L (ref 134–144)
eGFR: 58 mL/min/{1.73_m2} — ABNORMAL LOW (ref >59)

## 2023-04-04 ENCOUNTER — Encounter: Payer: Self-pay | Admitting: Neurology

## 2023-04-13 NOTE — Progress Notes (Signed)
Initial neurology clinic note  Reason for Evaluation: Consultation requested by Wilhemina Bonito., * for an opinion regarding neuropathy. My final recommendations will be communicated back to the requesting physician by way of shared medical record or letter to requesting physician via [place] mail.  HPI: This is [name], a 77 y.o. right-handed male with a medical history of HTN, HLD, pre-diabetes, OA, erectile dysfunction, and BPH who presents to neurology clinic with the chief complaint of numbness and tingling in bilateral lower extremities. The patient is alone today.  Patient is having tingling in his feet and ankles. He feels like it started in the right foot about 2 years ago after he broke his right ankle. He also has occasional paresthesias in the back of his arms and finger tips. It is not affecting function, per patient. He denies weakness, imbalance, or falls. He endorses some pain in his back between his shoulder blades, but otherwise does not have a lot of low back pain. He endorses some low back stiffness. Patient is concerned for neuropathy.  Patient gets occasional muscle cramps in his calves, especially over the last few months.  The patient does not report symptoms referable to autonomic dysfunction including impaired sweating, excessive mucosal dryness, gastroparetic early satiety, postprandial abdominal bloating, constipation, bowel or bladder dyscontrol. He endorses occasional lightheadedness when standing, but no syncope and feels cold more quickly than he used to. Patient has been treated for erectile dysfunction for about the last 8 years. He is currently getting a shot (Trimax) that seems to be helping.  He does not report any constitutional symptoms like fever, night sweats, anorexia or unintentional weight loss.  EtOH use: 1 drink per week; many years ago would drink more on the weekends  Restrictive diet? No Family history of neuropathy/myopathy/neurologic  disease? No  He has never had an EMG.   MEDICATIONS:  Outpatient Encounter Medications as of 04/20/2023  Medication Sig   amLODipine (NORVASC) 5 MG tablet Take 1 tablet (5 mg total) by mouth daily.   atorvastatin (LIPITOR) 80 MG tablet Take 1 tablet (80 mg total) by mouth daily.   lisinopril-hydrochlorothiazide (ZESTORETIC) 20-25 MG tablet Take 1 tablet by mouth daily.   tadalafil (CIALIS) 20 MG tablet Take 20 mg by mouth daily as needed for erectile dysfunction. (Patient not taking: Reported on 02/02/2023)   VITAMIN D, CHOLECALCIFEROL, PO Take by mouth. DAILY (Patient not taking: Reported on 02/02/2023)   No facility-administered encounter medications on file as of 04/20/2023.    PAST MEDICAL HISTORY: Past Medical History:  Diagnosis Date   Arthritis    lower back bulging discs x 2   Closed right ankle fracture 04/15/2020   fell down has cast on   Headache    Hepatitis    heaptitis a 1970's   Hyperlipemia    Hypertension     PAST SURGICAL HISTORY: Past Surgical History:  Procedure Laterality Date   MULTIPLE TOOTH EXTRACTIONS  2017   ORIF ANKLE FRACTURE Right 04/28/2020   Procedure: OPEN REDUCTION INTERNAL FIXATION (ORIF) ANKLE FRACTURE;  Surgeon: Sheral Apley, MD;  Location: [facility];  Service: Orthopedics;  Laterality: Right;   PERIRECTAL ABSCESS SURGERY  1970'S    ALLERGIES: No Known Allergies  FAMILY HISTORY: Family History  Problem Relation Age of Onset   Alcohol abuse Father     SOCIAL HISTORY: Social History   Tobacco Use   Smoking status: Former    Current packs/day: 0.00    Average  packs/day: 0.3 packs/day for 2.0 years (0.5 ttl pk-yrs)    Types: Cigarettes    Start date: 06/19/1966    Quit date: 06/19/1968    Years since quitting: 54.8   Smokeless tobacco: Never  Vaping Use   Vaping status: Never Used  Substance Use Topics   Alcohol use: Yes    Comment: occ 2 drinks per week   Drug use: Never   Social History   Social  History Narrative   Are you right handed or left handed? Right   Are you currently employed ?    What is your current occupation? retired   Do you live at home alone?no   Who lives with you? wife   What type of home do you live in: 1 story or 2 story? two   Caffeine 1 cup a day      OBJECTIVE: PHYSICAL EXAM: BP (!) 154/81   Pulse 90   Ht 5\' 10"  (1.778 m)   Wt 186 lb (84.4 kg)   SpO2 99%   BMI 26.69 kg/m   General: General appearance: Awake and alert. No distress. Cooperative with exam.  Skin: No obvious rash or jaundice. HEENT: Atraumatic. Anicteric. Lungs: Non-labored breathing on room air  Extremities: No obvious deformity.  Psych: Affect appropriate.  Neurological: Mental Status: Alert. Speech fluent. No pseudobulbar affect Cranial Nerves: CNII: No RAPD. Visual fields grossly intact. CNIII, IV, VI: PERRL. No nystagmus. EOMI. CN V: Facial sensation intact bilaterally to fine touch. CN VII: Facial muscles symmetric and strong. No ptosis at rest. CN VIII: Hearing grossly intact bilaterally. CN IX: No hypophonia. CN X: Palate elevates symmetrically. CN XI: Full strength shoulder shrug bilaterally. CN XII: Tongue protrusion full and midline. No atrophy or fasciculations. No significant dysarthria Motor: Tone is normal.  Individual muscle group testing (MRC grade out of 5):  Movement     Neck flexion 5    Neck extension 5     Right Left   Shoulder abduction 5 5   Shoulder adduction 5 5   Elbow flexion 5 5   Elbow extension 5 5   Finger abduction - FDI 5 5   Finger abduction - ADM 5 5   Finger extension 5 5   Finger distal flexion - 2/3 5 5    Finger distal flexion - 4/5 5 5    Thumb flexion - FPL 5 5   Thumb abduction - APB 5 5    Hip flexion 5 5   Hip extension 5 5   Hip adduction 5 5   Hip abduction 5 5   Knee extension 5 5   Knee flexion 5 5   Dorsiflexion 5 5   Plantarflexion 5 5   Great toe extension 5 5   Great toe flexion 5- 5-     Reflexes:  Right Left   Bicep 2+ 2+   Tricep 2+ 2+   BrRad 2+ 2+   Knee 2+ 2+   Ankle Trace 2+    Pathological Reflexes: Babinski: flexor response bilaterally Hoffman: absent bilaterally Troemner: absent bilaterally Sensation: Pinprick: Intact except for in right foot which is 90% of normal Vibration: Intact in all extremities Proprioception: Intact in bilateral great toes Coordination: Intact finger-to- nose-finger bilaterally. Romberg negative. Gait: Able to rise from chair with arms crossed unassisted. Normal, narrow-based gait.  Lab and Test Review: Internal labs: BMP (03/16/23): significant for glucose of 125, Cr 1.26 HbA1c (02/06/23): 6.1 Lipid panel (02/06/23): Component     Latest Ref Rng  02/06/2023  Cholesterol, Total     100 - 199 mg/dL 161 (H)   Triglycerides     0 - 149 mg/dL 68   HDL Cholesterol     >39 mg/dL 54   VLDL Cholesterol Cal     5 - 40 mg/dL 12   LDL Chol Calc (NIH)     0 - 99 mg/dL 096 (H)   Total CHOL/HDL Ratio     0.0 - 5.0 ratio 4.2     TSH (01/18/22): 1.130  Imaging: CT head wo contrast (04/15/20): FINDINGS: Brain: No evidence of acute infarction, hemorrhage, hydrocephalus, extra-axial collection or mass lesion/mass effect.   Vascular: Negative for hyperdense vessel   Skull: Negative   Sinuses/Orbits: Cyst in the right maxillary sinus otherwise clear sinuses. Negative orbit   Other: None   IMPRESSION: Negative CT head   ASSESSMENT: Aaron Gregory is a 77 y.o. male who presents for evaluation of numbness and tingling in feet and hands. He has a relevant medical history of HTN, HLD, pre-diabetes, OA, erectile dysfunction, and BPH. His neurological examination is pertinent for mildly diminished sensation in his right foot with absent right ankle jerk. Available diagnostic data is significant for HbA1c of 6.1. Patient's symptoms could be due to a mild peripheral neuropathy, but exam does not fully support this. Nerve damage from prior  trauma to right ankle could explain findings in the right foot/ankle. Overall, symptoms are not significant enough to patient currently that he wants treatment.  PLAN: -Blood work: B1, B12, IFE -Discussed EMG, patient prefers to defer for now -Recommend Lidocaine cream PRN -Recommend Alpha lipoic acid 600 mg once or twice daily -Discussed medication for neuropathic pain, but patient preferred to hold off for now  -Return to clinic as needed  The impression above as well as the plan as outlined below were extensively discussed with the patient who voiced understanding. All questions were answered to their satisfaction.  When available, results of the above investigations and possible further recommendations will be communicated to the patient via telephone/MyChart. Patient to call office if not contacted after expected testing turnaround time.   Total time spent reviewing records, interview, history/exam, documentation, and coordination of care on day of encounter:  50 min   Thank you for allowing me to participate in patient's care.  If I can answer any additional questions, I would be pleased to do so.  Jacquelyne Balint, MD   CC: Rema Fendt, NP [address] [place] [address]  CC: Referring provider: Wilhemina Bonito., MD [address] [place] [address],  [place] [address]

## 2023-04-20 ENCOUNTER — Other Ambulatory Visit (INDEPENDENT_AMBULATORY_CARE_PROVIDER_SITE_OTHER): Payer: Medicare HMO

## 2023-04-20 ENCOUNTER — Ambulatory Visit (INDEPENDENT_AMBULATORY_CARE_PROVIDER_SITE_OTHER): Payer: Medicare HMO | Admitting: Neurology

## 2023-04-20 ENCOUNTER — Encounter: Payer: Self-pay | Admitting: Neurology

## 2023-04-20 VITALS — BP 154/81 | HR 90 | Ht 70.0 in | Wt 186.0 lb

## 2023-04-20 DIAGNOSIS — R202 Paresthesia of skin: Secondary | ICD-10-CM

## 2023-04-20 DIAGNOSIS — R200 Anesthesia of skin: Secondary | ICD-10-CM | POA: Diagnosis not present

## 2023-04-20 DIAGNOSIS — R209 Unspecified disturbances of skin sensation: Secondary | ICD-10-CM

## 2023-04-20 LAB — VITAMIN B12: Vitamin B-12: 373 pg/mL (ref 211–911)

## 2023-04-20 NOTE — Patient Instructions (Signed)
I saw you today for numbness and tingling in your feet and hands. This could be neuropathy, but not completely clear on my exam.  I will get blood work today to look for treatable causes.  We discussed muscle and nerve testing, but agreed to hold off on this for now.  Alpha lipoic acid 600mg  daily has some research data suggesting it helps with nerve health. No major side effects other than <1% of people report upset stomach. This can be taken twice per day (1200mg  daily) if no relief obtained. You can buy this over the counter or online.   You can also try Lidocaine cream as needed. Apply wear you have pain, tingling, or burning. Wear gloves to prevent your hands being numb. This can be bought over the counter at any drug store or online.   Follow up with me as needed.  The physicians and staff at [facility] are committed to providing excellent care. You may receive a survey requesting feedback about your experience at our office. We strive to receive "very good" responses to the survey questions. If you feel that your experience would prevent you from giving the office a "very good " response, please contact our office to try to remedy the situation. We may be reached at [phone]. Thank you for taking the time out of your busy day to complete the survey.  Jacquelyne Balint, MD [facility]

## 2023-05-04 ENCOUNTER — Other Ambulatory Visit: Payer: Self-pay | Admitting: Family

## 2023-05-04 DIAGNOSIS — E785 Hyperlipidemia, unspecified: Secondary | ICD-10-CM

## 2023-05-04 DIAGNOSIS — I10 Essential (primary) hypertension: Secondary | ICD-10-CM

## 2023-05-09 ENCOUNTER — Encounter: Payer: Self-pay | Admitting: Family

## 2023-05-09 ENCOUNTER — Ambulatory Visit (INDEPENDENT_AMBULATORY_CARE_PROVIDER_SITE_OTHER): Payer: Medicare HMO | Admitting: Family

## 2023-05-09 VITALS — BP 136/83 | HR 72 | Temp 99.0°F | Ht 69.5 in | Wt 183.0 lb

## 2023-05-09 DIAGNOSIS — E785 Hyperlipidemia, unspecified: Secondary | ICD-10-CM | POA: Diagnosis not present

## 2023-05-09 DIAGNOSIS — I10 Essential (primary) hypertension: Secondary | ICD-10-CM

## 2023-05-09 DIAGNOSIS — M5136 Other intervertebral disc degeneration, lumbar region: Secondary | ICD-10-CM

## 2023-05-09 MED ORDER — AMLODIPINE BESYLATE 5 MG PO TABS: 5.0000 mg | ORAL_TABLET | Freq: Every day | ORAL | 0 refills | Status: DC

## 2023-05-09 NOTE — Progress Notes (Signed)
Patient ID: Aaron Gregory, male    DOB: 1946-05-06  MRN: [id number]  CC: Chronic Conditions Follow-Up  Subjective: Aaron Gregory is a 77 y.o. male who presents for chronic conditions follow-up.   His concerns today include:  - Doing well on Amlodipine and Lisinopril-Hydrochlorothiazide, no issues/concerns. He does not complain of red flag symptoms such as but not limited to chest pain, shortness of breath, worst headache of life, nausea/vomiting.  - Doing well on Atorvastatin, no issues/concerns.  - History of bulging disc lower back. Currently causing lower back discomfort. He denies recent trauma/injury and red flag symptoms. Reports in the past he was established with Orthopedics and would like to schedule a follow-up appointment with them soon. He declines medication to see if it helps. - Established with Neurology for management of neuropathy.    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 (Patient not taking: Reported on 02/02/2023)     VITAMIN D,  CHOLECALCIFEROL, PO Take by mouth. DAILY (Patient not taking: Reported on 02/02/2023)     No current facility-administered medications on file prior to visit.    No Known Allergies  Social History   Socioeconomic History   Marital status: Married    Spouse name: Not on file   Number of children: Not on file   Years of education: Not on file   Highest education level: Not on file  Occupational History   Not on file  Tobacco Use   Smoking status: Former    Current packs/day: 0.00    Average packs/day: 0.3 packs/day for 2.0 years (0.5 ttl pk-yrs)    Types: Cigarettes    Start date: 06/19/1966    Quit date: 06/19/1968    Years since quitting: 54.9   Smokeless tobacco: Never  Vaping Use   Vaping status: Never Used  Substance and Sexual Activity   Alcohol use: Yes    Comment: occ 2 drinks per week   Drug use: Never   Sexual activity: Yes  Other Topics Concern   Not on file  Social History Narrative   Are you right handed or left handed? Right   Are you currently employed ?    What is your current occupation? retired   Do you live at home alone?no   Who lives with you? wife   What type of home do you live in: 1 story or 2 story? two   Caffeine 1 cup a day    Social Determinants of Health   Financial Resource Strain: Low Risk  (02/02/2023)   Overall Financial Resource Strain (CARDIA)    Difficulty of Paying Living Expenses:  Not hard at all  Food Insecurity: No Food Insecurity (02/02/2023)   Hunger Vital Sign    Worried About Running Out of Food in the Last Year: Never true    Ran Out of Food in the Last Year: Never true  Transportation Needs: No Transportation Needs (02/02/2023)   PRAPARE - Administrator, Civil Service (Medical): No    Lack of Transportation (Non-Medical): No  Physical Activity: Inactive (02/02/2023)   Exercise Vital Sign    Days of Exercise per Week: 0 days    Minutes of Exercise per Session: 0 min  Stress: No Stress Concern Present  (02/02/2023)   [employer] of Occupational Health - Occupational Stress Questionnaire    Feeling of Stress : Not at all  Social Connections: Not on file  Intimate Partner Violence: Not on file    Family History  Problem Relation Age of Onset   Alcohol abuse Father     Past Surgical History:  Procedure Laterality Date   MULTIPLE TOOTH EXTRACTIONS  2017   ORIF ANKLE FRACTURE Right 04/28/2020   Procedure: OPEN REDUCTION INTERNAL FIXATION (ORIF) ANKLE FRACTURE;  Surgeon: Sheral Apley, MD;  Location: [facility];  Service: Orthopedics;  Laterality: Right;   PERIRECTAL ABSCESS SURGERY  1970'S    ROS: Review of Systems Negative except as stated above  PHYSICAL EXAM: BP 136/83   Pulse 72   Temp 99 F (37.2 C) (Oral)   Ht 5' 9.5" (1.765 m)   Wt 183 lb (83 kg)   SpO2 97%   BMI 26.64 kg/m   Physical Exam HENT:     Head: Normocephalic and atraumatic.     Nose: Nose normal.     Mouth/Throat:     Mouth: Mucous membranes are moist.     Pharynx: Oropharynx is clear.  Eyes:     Extraocular Movements: Extraocular movements intact.     Conjunctiva/sclera: Conjunctivae normal.     Pupils: Pupils are equal, round, and reactive to light.  Cardiovascular:     Rate and Rhythm: Normal rate and regular rhythm.     Pulses: Normal pulses.     Heart sounds: Normal heart sounds.  Pulmonary:     Effort: Pulmonary effort is normal.     Breath sounds: Normal breath sounds.  Musculoskeletal:        General: Normal range of motion.     Right shoulder: Normal.     Left shoulder: Normal.     Right upper arm: Normal.     Left upper arm: Normal.     Right elbow: Normal.     Left elbow: Normal.     Right forearm: Normal.     Left forearm: Normal.     Right wrist: Normal.     Left wrist: Normal.     Right hand: Normal.     Left hand: Normal.     Cervical back: Normal, normal range of motion and neck supple.     Thoracic back: Normal.     Lumbar back: Tenderness  present.     Right hip: Normal.     Left hip: Normal.     Right upper leg: Normal.     Left upper leg: Normal.     Right knee: Normal.     Left knee: Normal.     Right lower leg: Normal.     Left lower leg: Normal.     Right ankle: Normal.     Left ankle: Normal.  Right foot: Normal.     Left foot: Normal.  Neurological:     General: No focal deficit present.     Mental Status: He is alert and oriented to person, place, and time.  Psychiatric:        Mood and Affect: Mood normal.        Behavior: Behavior normal.     ASSESSMENT AND PLAN: 1. Primary hypertension - Continue Amlodipine as prescribed.  - Continue Lisinopril-Hydrochlorothiazide as prescribed. No refills needed as of present. - Counseled on blood pressure goal of less than 130/80, low-sodium, DASH diet, medication compliance, and 150 minutes of moderate intensity exercise per week as tolerated. Counseled on medication adherence and adverse effects. - Follow-up with primary provider in 3 months or sooner if needed.  - amLODipine (NORVASC) 5 MG tablet; Take 1 tablet (5 mg total) by mouth daily.  Dispense: 90 tablet; Refill: 0  2. Hyperlipidemia, unspecified hyperlipidemia type - Continue Atorvastatin as prescribed. No refills needed as of present.  - Follow-up with primary provider in 3 months or sooner if needed.   3. Bulging lumbar disc - Referral to Orthopedics for further evaluation/management.  - Ambulatory referral to Orthopedics    Patient was given the opportunity to ask questions.  Patient verbalized understanding of the plan and was able to repeat key elements of the plan. Patient was given clear instructions to go to Emergency Department or return to medical center if symptoms don't improve, worsen, or new problems develop.The patient verbalized understanding.   Orders Placed This Encounter  Procedures   Ambulatory referral to Orthopedics     Requested Prescriptions   Signed Prescriptions Disp  Refills   amLODipine (NORVASC) 5 MG tablet 90 tablet 0    Sig: Take 1 tablet (5 mg total) by mouth daily.    Return in about 3 months (around 08/09/2023) for Follow-Up or next available chronic conditions .  Rema Fendt, NP

## 2023-05-09 NOTE — Progress Notes (Signed)
Pt states pain in left side lower back. States it feels like a pulled muscle.

## 2023-05-25 ENCOUNTER — Ambulatory Visit (INDEPENDENT_AMBULATORY_CARE_PROVIDER_SITE_OTHER): Payer: Medicare HMO | Admitting: Orthopedic Surgery

## 2023-05-25 ENCOUNTER — Other Ambulatory Visit (INDEPENDENT_AMBULATORY_CARE_PROVIDER_SITE_OTHER): Payer: Medicare HMO

## 2023-05-25 ENCOUNTER — Encounter: Payer: Self-pay | Admitting: Orthopedic Surgery

## 2023-05-25 VITALS — BP 176/105 | HR 91 | Ht 69.5 in | Wt 183.0 lb

## 2023-05-25 DIAGNOSIS — M545 Low back pain, unspecified: Secondary | ICD-10-CM

## 2023-05-25 NOTE — Progress Notes (Signed)
Orthopedic Spine Surgery Office Note  Assessment: Patient is a 77 y.o. male with low back pain with no radicular symptoms, suspect facet arthropathy as etiology   Plan: -Explained that initially conservative treatment is tried as a significant number of patients may experience relief with these treatment modalities. Discussed that the conservative treatments include:  -activity modification  -physical therapy  -over the counter pain medications  -muscle relaxers  -steroid injections -Patient has tried heating pads -Recommended core strengthening.  Told him that he can take 1000 mg of Tylenol up to 3 times per day.  For bad days, I told him he could take Aleve in addition to the Tylenol -Patient should return to office on an as-needed basis   Patient expressed understanding of the plan and all questions were answered to the patient's satisfaction.   ___________________________________________________________________________   History:  Patient is a 77 y.o. male who presents today for lumbar spine.  Patient has had over a year of low back pain.  He feels on the left side of his lower lumbar spine in the area of the paraspinal muscles.  Pain has been stable over time.  He rates the pain as a 2 or 3 out of 10.  On a really bad day, it will be a 4 out of 10.  He has no pain radiating into either lower extremity.  He mostly notices the pain when he is changing position.  He gives the example of getting out of a chair.  There is no trauma or injury that preceded the onset of pain.   Weakness: Denies Symptoms of imbalance: Denies Paresthesias and numbness: Yes, sometimes gets numbness and paresthesias in his right foot which developed after ankle fracture ORIF.  No new or other numbness/paresthesias Bowel or bladder incontinence: Denies Saddle anesthesia: Denies  Treatments tried: Heating pads  Review of systems: Denies fevers and chills, night sweats, unexplained weight loss, history of  cancer, pain that wakes them at night  Past medical history: HLD HTN Pre-diabetes (last A1C was 6.1 on 02/06/2023)  Allergies: NKDA  Past surgical history:  Right ankle fracture ORIF Perirectal abscess surgery  Social history: Denies use of nicotine product (smoking, vaping, patches, smokeless) Alcohol use: Less than 1 drink per week Denies recreational drug use   Physical Exam:  BMI of 26.6  General: no acute distress, appears stated age Neurologic: alert, answering questions appropriately, following commands Respiratory: unlabored breathing on room air, symmetric chest rise Psychiatric: appropriate affect, normal cadence to speech   MSK (spine):  -Strength exam      Left  Right EHL    5/5  5/5 TA    5/5  5/5 GSC    5/5  5/5 Knee extension  5/5  5/5 Hip flexion   5/5  5/5  -Sensory exam    Sensation intact to light touch in L3-S1 nerve distributions of bilateral lower extremities  -Straight leg raise: Negative bilaterally -Femoral nerve stretch test: Negative bilaterally -Clonus: no beats bilaterally  -Left hip exam: No pain through range of motion, negative Stinchfield, negative FABER -Right hip exam: No pain through range of motion, negative Stinchfield, negative FABER  Imaging: XR of the lumbar spine from 05/25/2023 was independently reviewed and interpreted, showing long gradual scoliotic curvature with apex to the left.  Cobb angle measures 22 degrees.  Disc height loss at L3/4, L4/5, L5/S1.  Spondylolisthesis seen at L4/5 that shifts about 0.5 mm between flexion and extension views.  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  No fracture or dislocation seen.   Patient name: Aaron Gregory Patient MRN: [id number] Date of visit: 05/25/23

## 2023-08-02 ENCOUNTER — Other Ambulatory Visit: Payer: Self-pay

## 2023-08-02 ENCOUNTER — Other Ambulatory Visit: Payer: Self-pay | Admitting: Family

## 2023-08-02 DIAGNOSIS — I10 Essential (primary) hypertension: Secondary | ICD-10-CM

## 2023-08-02 DIAGNOSIS — E785 Hyperlipidemia, unspecified: Secondary | ICD-10-CM

## 2023-08-02 MED ORDER — LISINOPRIL-HYDROCHLOROTHIAZIDE 20-25 MG PO TABS: 1.0000 | ORAL_TABLET | Freq: Every day | ORAL | 0 refills | Status: DC

## 2023-08-02 MED ORDER — ATORVASTATIN CALCIUM 80 MG PO TABS: 80.0000 mg | ORAL_TABLET | Freq: Every day | ORAL | 0 refills | Status: DC

## 2023-08-09 ENCOUNTER — Ambulatory Visit (INDEPENDENT_AMBULATORY_CARE_PROVIDER_SITE_OTHER): Payer: Medicare HMO | Admitting: Family

## 2023-08-09 ENCOUNTER — Encounter: Payer: Self-pay | Admitting: Family

## 2023-08-09 VITALS — BP 138/88 | HR 85 | Temp 98.4°F | Ht 69.5 in | Wt 189.0 lb

## 2023-08-09 DIAGNOSIS — I10 Essential (primary) hypertension: Secondary | ICD-10-CM

## 2023-08-09 DIAGNOSIS — E785 Hyperlipidemia, unspecified: Secondary | ICD-10-CM | POA: Diagnosis not present

## 2023-08-09 DIAGNOSIS — R7303 Prediabetes: Secondary | ICD-10-CM | POA: Diagnosis not present

## 2023-08-09 MED ORDER — AMLODIPINE BESYLATE 5 MG PO TABS: 5.0000 mg | ORAL_TABLET | Freq: Every day | ORAL | 0 refills | Status: DC

## 2023-08-09 MED ORDER — LISINOPRIL-HYDROCHLOROTHIAZIDE 20-25 MG PO TABS: 1.0000 | ORAL_TABLET | Freq: Every day | ORAL | 0 refills | Status: DC

## 2023-08-09 MED ORDER — ATORVASTATIN CALCIUM 80 MG PO TABS
80.0000 mg | ORAL_TABLET | Freq: Every day | ORAL | 0 refills | Status: DC
Start: 2023-08-09 — End: 2024-01-26

## 2023-08-09 NOTE — Progress Notes (Signed)
Patient states no concerns to discuss.  Declined Flu vaccine

## 2023-08-09 NOTE — Progress Notes (Signed)
Patient ID: Aaron Gregory, male    DOB: 08-10-1946  MRN: [id number]  CC: Chronic Conditions Follow-Up  Subjective: Aaron Gregory is a 77 y.o. male who presents for chronic conditions follow-up.   His concerns today include:  - Doing well on Amlodipine and Lisinopril-Hydrochlorothiazide, no issues/concerns. He does not complain of red flag symptoms such as but not limited to chest pain, shortness of breath, worst headache of life, nausea/vomiting.  - Doing well on Atorvastatin, no issues/concerns.   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 DAILY     No current facility-administered medications on file prior to visit.    No Known Allergies  Social History   Socioeconomic History   Marital status: Married    Spouse name: Not on file   Number of children: Not on file   Years of education: Not on file   Highest education level: Not on file  Occupational History   Not on file  Tobacco Use   Smoking status: Former    Current packs/day: 0.00    Average packs/day: 0.3 packs/day for 2.0 years (0.5 ttl pk-yrs)    Types: Cigarettes    Start date: 06/19/1966    Quit date: 06/19/1968    Years since quitting: 55.1   Smokeless tobacco: Never  Vaping Use   Vaping status: Never Used   Substance and Sexual Activity   Alcohol use: Yes    Comment: occ 2 drinks per week   Drug use: Never   Sexual activity: Yes  Other Topics Concern   Not on file  Social History Narrative   Are you right handed or left handed? Right   Are you currently employed ?    What is your current occupation? retired   Do you live at home alone?no   Who lives with you? wife   What type of home do you live in: 1 story or 2 story? two   Caffeine 1 cup a day    Social Determinants of Health   Financial Resource Strain: Low Risk  (02/02/2023)   Overall Financial Resource Strain (CARDIA)    Difficulty of Paying Living Expenses: Not hard at all  Food Insecurity: No Food Insecurity (02/02/2023)   Hunger Vital Sign    Worried About Running Out of Food in the Last Year: Never true    Ran Out of Food in the Last Year: Never true  Transportation Needs: No Transportation Needs (02/02/2023)   PRAPARE - Administrator, Civil Service (Medical): No    Lack of Transportation (Non-Medical): No  Physical Activity: Inactive (02/02/2023)   Exercise Vital Sign    Days of Exercise per Week: 0 days    Minutes of Exercise per Session: 0 min  Stress: No Stress Concern Present (02/02/2023)   [employer] of Occupational Health - Occupational Stress Questionnaire  Feeling of Stress : Not at all  Social Connections: Not on file  Intimate Partner Violence: Not on file    Family History  Problem Relation Age of Onset   Alcohol abuse Father     Past Surgical History:  Procedure Laterality Date   MULTIPLE TOOTH EXTRACTIONS  2017   ORIF ANKLE FRACTURE Right 04/28/2020   Procedure: OPEN REDUCTION INTERNAL FIXATION (ORIF) ANKLE FRACTURE;  Surgeon: Sheral Apley, MD;  Location: [facility];  Service: Orthopedics;  Laterality: Right;   PERIRECTAL ABSCESS SURGERY  1970'S    ROS: Review of Systems Negative except as stated above  PHYSICAL EXAM: BP 138/88   Pulse 85   Temp 98.4  F (36.9 C) (Oral)   Ht 5' 9.5" (1.765 m)   Wt 189 lb (85.7 kg)   SpO2 98%   BMI 27.51 kg/m   Physical Exam HENT:     Head: Normocephalic and atraumatic.     Nose: Nose normal.     Mouth/Throat:     Mouth: Mucous membranes are moist.     Pharynx: Oropharynx is clear.  Eyes:     Extraocular Movements: Extraocular movements intact.     Conjunctiva/sclera: Conjunctivae normal.     Pupils: Pupils are equal, round, and reactive to light.  Cardiovascular:     Rate and Rhythm: Normal rate and regular rhythm.     Pulses: Normal pulses.     Heart sounds: Normal heart sounds.  Pulmonary:     Effort: Pulmonary effort is normal.     Breath sounds: Normal breath sounds.  Musculoskeletal:        General: Normal range of motion.     Cervical back: Normal range of motion and neck supple.  Neurological:     General: No focal deficit present.     Mental Status: He is alert and oriented to person, place, and time.  Psychiatric:        Mood and Affect: Mood normal.        Behavior: Behavior normal.    ASSESSMENT AND PLAN: 1. Primary hypertension - Continue Amlodipine and Lisinopril-Hydrochlorothiazide as prescribed.  - Routine screening.  - Counseled on blood pressure goal of less than 140/90, low-sodium, DASH diet, medication compliance, 150 minutes of moderate intensity exercise per week as tolerated. Discussed medication compliance, adverse effects. - Follow-up with primary provider in 3 months or sooner if needed.  - amLODipine (NORVASC) 5 MG tablet; Take 1 tablet (5 mg total) by mouth daily.  Dispense: 90 tablet; Refill: 0 - lisinopril-hydrochlorothiazide (ZESTORETIC) 20-25 MG tablet; Take 1 tablet by mouth daily.  Dispense: 90 tablet; Refill: 0 - Basic Metabolic Panel  2. Hyperlipidemia, unspecified hyperlipidemia type - Continue Atorvastatin as prescribed. Counseled on medication adherence/adverse effects. - Follow-up with primary provider in 3 months or sooner if needed.  -  atorvastatin (LIPITOR) 80 MG tablet; Take 1 tablet (80 mg total) by mouth daily.  Dispense: 90 tablet; Refill: 0  3. Prediabetes - Routine screening.  - Hemoglobin A1c    Patient was given the opportunity to ask questions.  Patient verbalized understanding of the plan and was able to repeat key elements of the plan. Patient was given clear instructions to go to Emergency Department or return to medical center if symptoms don't improve, worsen, or new problems develop.The patient verbalized understanding.   Orders Placed This Encounter  Procedures   Basic Metabolic Panel   Hemoglobin A1c     Requested Prescriptions   Signed Prescriptions  Disp Refills   amLODipine (NORVASC) 5 MG tablet 90 tablet 0    Sig: Take 1 tablet (5 mg total) by mouth daily.   atorvastatin (LIPITOR) 80 MG tablet 90 tablet 0    Sig: Take 1 tablet (80 mg total) by mouth daily.   lisinopril-hydrochlorothiazide (ZESTORETIC) 20-25 MG tablet 90 tablet 0    Sig: Take 1 tablet by mouth daily.    Return in about 3 months (around 11/09/2023) for Follow-Up or next available chronic conditions.  Rema Fendt, NP

## 2023-08-10 LAB — BASIC METABOLIC PANEL
BUN/Creatinine Ratio: 10 (ref 10–24)
BUN: 12 mg/dL (ref 8–27)
CO2: 27 mmol/L (ref 20–29)
Calcium: 10 mg/dL (ref 8.6–10.2)
Chloride: 100 mmol/L (ref 96–106)
Creatinine, Ser: 1.15 mg/dL (ref 0.76–1.27)
Glucose: 131 mg/dL — ABNORMAL HIGH (ref 70–99)
Potassium: 4.1 mmol/L (ref 3.5–5.2)
Sodium: 142 mmol/L (ref 134–144)
eGFR: 65 mL/min/{1.73_m2} (ref >59)

## 2023-08-10 LAB — HEMOGLOBIN A1C
Est. average glucose Bld gHb Est-mCnc: 128 mg/dL
Hgb A1c MFr Bld: 6.1 % — ABNORMAL HIGH (ref 4.8–5.6)

## 2023-11-10 ENCOUNTER — Ambulatory Visit: Payer: Medicare HMO | Admitting: Family

## 2023-12-06 ENCOUNTER — Ambulatory Visit: Payer: Medicare HMO | Admitting: Family

## 2023-12-18 NOTE — Progress Notes (Unsigned)
 I saw Ananias Kolander in neurology clinic on 12/22/23 in follow up for neuropathy.  HPI: Montague Corella is a 78 y.o. year old male with a history of HTN, HLD, pre-diabetes, OA, erectile dysfunction, and BPH who we last saw on 04/20/23.  To briefly review: Patient is having tingling in his feet and ankles. He feels like it started in the right foot about 2 years ago after he broke his right ankle. He also has occasional paresthesias in the back of his arms and finger tips. It is not affecting function, per patient. He denies weakness, imbalance, or falls. He endorses some pain in his back between his shoulder blades, but otherwise does not have a lot of low back pain. He endorses some low back stiffness. Patient is concerned for neuropathy.   Patient gets occasional muscle cramps in his calves, especially over the last few months.   The patient does not report symptoms referable to autonomic dysfunction including impaired sweating, excessive mucosal dryness, gastroparetic early satiety, postprandial abdominal bloating, constipation, bowel or bladder dyscontrol. He endorses occasional lightheadedness when standing, but no syncope and feels cold more quickly than he used to. Patient has been treated for erectile dysfunction for about the last 8 years. He is currently getting a shot (Trimax) that seems to be helping.   He does not report any constitutional symptoms like fever, night sweats, anorexia or unintentional weight loss.   EtOH use: 1 drink per week; many years ago would drink more on the weekends  Restrictive diet? No Family history of neuropathy/myopathy/neurologic disease? No  Most recent Assessment and Plan (04/20/23): Cylan Borum is a 78 y.o. male who presents for evaluation of numbness and tingling in feet and hands. He has a relevant medical history of HTN, HLD, pre-diabetes, OA, erectile dysfunction, and BPH. His neurological examination is pertinent for mildly diminished sensation in his right  foot with absent right ankle jerk. Available diagnostic data is significant for HbA1c of 6.1. Patient's symptoms could be due to a mild peripheral neuropathy, but exam does not fully support this. Nerve damage from prior trauma to right ankle could explain findings in the right foot/ankle. Overall, symptoms are not significant enough to patient currently that he wants treatment.   PLAN: -Blood work: B1, B12, IFE -Discussed EMG, patient prefers to defer for now -Recommend Lidocaine cream PRN -Recommend Alpha lipoic acid 600 mg once or twice daily -Discussed medication for neuropathic pain, but patient preferred to hold off for now  -Return to clinic as needed  Since their last visit: Patient returns because he is having more tingling and numbness in the left foot. He denies imbalance or falls. He only occasionally has symptoms in his hands.  He may have tried lidocaine cream but not recently.   MEDICATIONS:  Outpatient Encounter Medications as of 12/22/2023  Medication Sig   amLODipine (NORVASC) 5 MG tablet Take 1 tablet (5 mg total) by mouth daily.   atorvastatin (LIPITOR) 80 MG tablet Take 1 tablet (80 mg total) by mouth daily.   lisinopril-hydrochlorothiazide (ZESTORETIC) 20-25 MG tablet Take 1 tablet by mouth daily.   VITAMIN D, CHOLECALCIFEROL, PO Take by mouth. DAILY   No facility-administered encounter medications on file as of 12/22/2023.    PAST MEDICAL HISTORY: Past Medical History:  Diagnosis Date   Arthritis    lower back bulging discs x 2   Closed right ankle fracture 04/15/2020   fell down has cast on   Headache    Hepatitis  heaptitis a 1970's   Hyperlipemia    Hypertension     PAST SURGICAL HISTORY: Past Surgical History:  Procedure Laterality Date   MULTIPLE TOOTH EXTRACTIONS  2017   ORIF ANKLE FRACTURE Right 04/28/2020   Procedure: OPEN REDUCTION INTERNAL FIXATION (ORIF) ANKLE FRACTURE;  Surgeon: Sheral Apley, MD;  Location: [facility];  Service: Orthopedics;  Laterality: Right;   PERIRECTAL ABSCESS SURGERY  1970'S    ALLERGIES: No Known Allergies  FAMILY HISTORY: Family History  Problem Relation Age of Onset   Alcohol abuse Father     SOCIAL HISTORY: Social History   Tobacco Use   Smoking status: Former    Current packs/day: 0.00    Average packs/day: 0.3 packs/day for 2.0 years (0.5 ttl pk-yrs)    Types: Cigarettes    Start date: 06/19/1966    Quit date: 06/19/1968    Years since quitting: 55.5   Smokeless tobacco: Never  Vaping Use   Vaping status: Never Used  Substance Use Topics   Alcohol use: Yes    Comment: occ 2 drinks per week   Drug use: Never   Social History   Social History Narrative   Are you right handed or left handed? Right   Are you currently employed ?    What is your current occupation? retired   Do you live at home alone?no   Who lives with you? wife   What type of home do you live in: 1 story or 2 story? two   Caffeine 1 cup a day     Objective:  Vital Signs:  BP (!) 170/88   Pulse 93   Ht 5' 9.5" (1.765 m)   Wt 190 lb (86.2 kg)   SpO2 100%   BMI 27.66 kg/m   General: General appearance: Awake and alert. No distress. Cooperative with exam.  Skin: No obvious rash or jaundice. HEENT: Atraumatic. Anicteric. Lungs: Non-labored breathing on room air  Extremities: No edema.   Neurological: Mental Status: Alert. Speech fluent. No pseudobulbar affect Cranial Nerves: CNII: No RAPD. Visual fields intact. CNIII, IV, VI: PERRL. No nystagmus. EOMI. CN V: Facial sensation intact bilaterally to fine touch. CN VII: Facial muscles symmetric and strong. No ptosis at rest. CN VIII: Hears finger rub well bilaterally. CN IX: No hypophonia. CN X: Palate elevates symmetrically. CN XI: Full strength shoulder shrug bilaterally. CN XII: Tongue protrusion full and midline. No atrophy or fasciculations. No significant dysarthria Motor: Tone is normal. Strength 5/5 in  bilateral upper and lower extremities  Reflexes:  Right Left  Bicep 2+ 2+  Tricep 2+ 2+  BrRad 2+ 2+  Knee 2+ 2+  Ankle 0 0   Sensation: Pinprick: Increased sensitivity in bilateral feet Vibration: Diminished in bilateral great toes and ankles, otherwise intact Proprioception: Intact in bilateral great toes Coordination: Intact finger-to- nose-finger bilaterally. Romberg negative. Gait: Able to rise from chair with arms crossed unassisted. Normal, narrow-based gait.   Lab and Test Review: New results: 04/20/23: B1 wnl IFE no M protein B12: 373  HbA1c (08/09/23): 6.1  Previously reviewed results: BMP (03/16/23): significant for glucose of 125, Cr 1.26 HbA1c (02/06/23): 6.1 Lipid panel (02/06/23): Component     Latest Ref Rng 02/06/2023  Cholesterol, Total     100 - 199 mg/dL 161 (H)   Triglycerides     0 - 149 mg/dL 68   HDL Cholesterol     >39 mg/dL 54   VLDL Cholesterol Cal     5 -  40 mg/dL 12   LDL Chol Calc (NIH)     0 - 99 mg/dL 846 (H)   Total CHOL/HDL Ratio     0.0 - 5.0 ratio 4.2     TSH (01/18/22): 1.130   CT head wo contrast (04/15/20): FINDINGS: Brain: No evidence of acute infarction, hemorrhage, hydrocephalus, extra-axial collection or mass lesion/mass effect.   Vascular: Negative for hyperdense vessel   Skull: Negative   Sinuses/Orbits: Cyst in the right maxillary sinus otherwise clear sinuses. Negative orbit   Other: None   IMPRESSION: Negative CT head  ASSESSMENT: This is Teran Knittle, a 78 y.o. male with numbness and tingling in bilateral feet. 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 His known risk factors include pre-diabetes and exposure to agent orange.  Plan: -Lidocaine cream PRN -Alpha lipoic acid 600 mg once or twice daily -Patient does not want medicine yet, but would try gabapentin next if he changes his mind  Return to clinic in 6 months  Total time spent reviewing records, interview, history/exam,  documentation, and coordination of care on day of encounter:  30 min  Jacquelyne Balint, MD

## 2023-12-22 ENCOUNTER — Ambulatory Visit (INDEPENDENT_AMBULATORY_CARE_PROVIDER_SITE_OTHER): Admitting: Neurology

## 2023-12-22 ENCOUNTER — Encounter: Payer: Self-pay | Admitting: Neurology

## 2023-12-22 VITALS — BP 170/88 | HR 93 | Ht 69.5 in | Wt 190.0 lb

## 2023-12-22 DIAGNOSIS — R209 Unspecified disturbances of skin sensation: Secondary | ICD-10-CM

## 2023-12-22 DIAGNOSIS — R202 Paresthesia of skin: Secondary | ICD-10-CM | POA: Diagnosis not present

## 2023-12-22 DIAGNOSIS — R200 Anesthesia of skin: Secondary | ICD-10-CM

## 2023-12-22 NOTE — Patient Instructions (Addendum)
 Alpha lipoic acid 600mg  daily has some research data suggesting it helps with nerve health. No major side effects other than <1% of people report upset stomach. This can be taken twice per day (1200mg  daily) if no relief obtained. You can buy this over the counter or online.  You can also try Lidocaine cream as needed. Apply wear you have pain, tingling, or burning. Wear gloves to prevent your hands being numb. This can be bought over the counter at any drug store or online.   The physicians and staff at [facility] are committed to providing excellent care. You may receive a survey requesting feedback about your experience at our office. We strive to receive "very good" responses to the survey questions. If you feel that your experience would prevent you from giving the office a "very good " response, please contact our office to try to remedy the situation. We may be reached at [phone]. Thank you for taking the time out of your busy day to complete the survey.  Jacquelyne Balint, MD [facility]

## 2023-12-27 ENCOUNTER — Encounter: Admitting: Family

## 2023-12-27 NOTE — Progress Notes (Signed)
 Erroneous encounter-disregard

## 2024-01-25 ENCOUNTER — Ambulatory Visit: Payer: Medicare HMO

## 2024-01-25 VITALS — BP 170/88 | Ht 69.5 in | Wt 189.0 lb

## 2024-01-25 DIAGNOSIS — Z2821 Immunization not carried out because of patient refusal: Secondary | ICD-10-CM

## 2024-01-25 DIAGNOSIS — Z0000 Encounter for general adult medical examination without abnormal findings: Secondary | ICD-10-CM

## 2024-01-25 NOTE — Progress Notes (Signed)
 Because this visit was a virtual/telehealth visit,  certain criteria was not obtained, such a blood pressure, CBG if applicable, and timed get up and go. Any medications not marked as "taking" were not mentioned during the medication reconciliation part of the visit. Any vitals not documented were not able to be obtained due to this being a telehealth visit or patient was unable to self-report a recent blood pressure reading due to a lack of equipment at home via telehealth. Vitals that have been documented are verbally provided by the patient.  Subjective:   Aaron Gregory is a 78 y.o. who presents for a Medicare Wellness preventive visit.  As a reminder, Annual Wellness Visits don't include a physical exam, and some assessments may be limited, especially if this visit is performed virtually. We may recommend an in-person visit if needed.  Visit Complete: Virtual I connected with  Aaron Gregory on 01/25/24 by a audio enabled telemedicine application and verified that I am speaking with the correct person using two identifiers.  Patient Location: Home  Provider Location: Home Office  I discussed the limitations of evaluation and management by telemedicine. The patient expressed understanding and agreed to proceed.  Vital Signs: Because this visit was a virtual/telehealth visit, some criteria may be missing or patient reported. Any vitals not documented were not able to be obtained and vitals that have been documented are patient reported.  VideoDeclined- This patient declined Librarian, academic. Therefore the visit was completed with audio only.  Persons Participating in Visit: Patient.  AWV Questionnaire: No: Patient Medicare AWV questionnaire was not completed prior to this visit.  Cardiac Risk Factors include: advanced age (>33men, >73 women);hypertension;dyslipidemia     Objective:     Today's Vitals   01/25/24 1547  BP: (!) 170/88  Weight: 189 lb (85.7 kg)   Height: 5' 9.5" (1.765 m)   Body mass index is 27.51 kg/m.     01/25/2024    3:52 PM 12/22/2023    1:50 PM 04/20/2023   12:56 PM 02/02/2023   11:32 AM 04/28/2020   11:28 AM  Advanced Directives  Does Patient Have a Medical Advance Directive? No No No No No  Would patient like information on creating a medical advance directive? No - Patient declined    No - Patient declined    Current Medications (verified) Outpatient Encounter Medications as of 01/25/2024  Medication Sig   VITAMIN D, CHOLECALCIFEROL, PO Take by mouth. DAILY   amLODipine  (NORVASC ) 5 MG tablet Take 1 tablet (5 mg total) by mouth daily.   atorvastatin  (LIPITOR) 80 MG tablet Take 1 tablet (80 mg total) by mouth daily.   lisinopril -hydrochlorothiazide  (ZESTORETIC ) 20-25 MG tablet Take 1 tablet by mouth daily.   No facility-administered encounter medications on file as of 01/25/2024.    Allergies (verified) Patient has no known allergies.   History: Past Medical History:  Diagnosis Date   Arthritis    lower back bulging discs x 2   Closed right ankle fracture 04/15/2020   fell down has cast on   Headache    Hepatitis    heaptitis a 1970's   Hyperlipemia    Hypertension    Past Surgical History:  Procedure Laterality Date   MULTIPLE TOOTH EXTRACTIONS  2017   ORIF ANKLE FRACTURE Right 04/28/2020   Procedure: OPEN REDUCTION INTERNAL FIXATION (ORIF) ANKLE FRACTURE;  Surgeon: Saundra Curl, MD;  Location: [facility];  Service: Orthopedics;  Laterality: Right;   PERIRECTAL ABSCESS SURGERY  1970'S   Family History  Problem Relation Age of Onset   Alcohol abuse Father    Social History   Socioeconomic History   Marital status: Married    Spouse name: Not on file   Number of children: Not on file   Years of education: Not on file   Highest education level: Not on file  Occupational History   Not on file  Tobacco Use   Smoking status: Former    Current packs/day: 0.00    Average  packs/day: 0.3 packs/day for 2.0 years (0.5 ttl pk-yrs)    Types: Cigarettes    Start date: 06/19/1966    Quit date: 06/19/1968    Years since quitting: 55.6   Smokeless tobacco: Never  Vaping Use   Vaping status: Never Used  Substance and Sexual Activity   Alcohol use: Yes    Comment: occ 2 drinks per week   Drug use: Never   Sexual activity: Yes  Other Topics Concern   Not on file  Social History Narrative   Are you right handed or left handed? Right   Are you currently employed ?    What is your current occupation? retired   Do you live at home alone?no   Who lives with you? wife   What type of home do you live in: 1 story or 2 story? two   Caffeine 1 cup a day    Social Drivers of Corporate investment banker Strain: Low Risk  (01/25/2024)   Overall Financial Resource Strain (CARDIA)    Difficulty of Paying Living Expenses: Not hard at all  Food Insecurity: No Food Insecurity (01/25/2024)   Hunger Vital Sign    Worried About Running Out of Food in the Last Year: Never true    Ran Out of Food in the Last Year: Never true  Transportation Needs: No Transportation Needs (01/25/2024)   PRAPARE - Administrator, Civil Service (Medical): No    Lack of Transportation (Non-Medical): No  Physical Activity: Inactive (01/25/2024)   Exercise Vital Sign    Days of Exercise per Week: 0 days    Minutes of Exercise per Session: 0 min  Stress: No Stress Concern Present (01/25/2024)   [employer] of Occupational Health - Occupational Stress Questionnaire    Feeling of Stress : Not at all  Social Connections: Socially Integrated (01/25/2024)   Social Connection and Isolation Panel [NHANES]    Frequency of Communication with Friends and Family: Twice a week    Frequency of Social Gatherings with Friends and Family: Twice a week    Attends Religious Services: More than 4 times per year    Active Member of [employer] or Organizations: Yes    Attends Banker Meetings: Never     Marital Status: Married    Tobacco Counseling Counseling given: Not Answered    Clinical Intake:  Pre-visit preparation completed: Yes  Pain : No/denies pain     BMI - recorded: 27.51 Nutritional Status: BMI 25 -29 Overweight Nutritional Risks: None Diabetes: No  Lab Results  Component Value Date   HGBA1C 6.1 (H) 08/09/2023   HGBA1C 6.1 (H) 02/06/2023   HGBA1C 6.0 (H) 01/19/2022     How often do you need to have someone help you when you read instructions, pamphlets, or other written materials from your doctor or pharmacy?: 1 - Never  Interpreter Needed?: No  Information entered by :: [employer]   Activities of Daily Living  01/25/2024    3:51 PM 02/02/2023   11:33 AM  In your present state of health, do you have any difficulty performing the following activities:  Hearing? 0 1  Vision? 0 0  Difficulty concentrating or making decisions? 0 0  Walking or climbing stairs? 0 0  Dressing or bathing? 0 0  Doing errands, shopping? 0 0  Preparing Food and eating ? N N  Using the Toilet? N N  In the past six months, have you accidently leaked urine? N N  Do you have problems with loss of bowel control? N N  Managing your Medications? N N  Managing your Finances? N N  Housekeeping or managing your Housekeeping? N N    Patient Care Team: Senaida Dama, NP as PCP - General (Nurse Practitioner)  Indicate any recent Medical Services you may have received from other than [facility] providers in the past year (date may be approximate).     Assessment:    This is a routine wellness examination for Aaron Gregory.  Hearing/Vision screen Hearing Screening - Comments:: No difficulties hearing  Vision Screening - Comments:: Wears glasses   Goals Addressed             This Visit's Progress    Patient Stated   On track    02/02/2023, stay healthy       Depression Screen     01/25/2024    3:52 PM 08/09/2023    1:44 PM 02/02/2023   11:33 AM 11/12/2021    8:14  AM 09/16/2021   10:00 AM  PHQ 2/9 Scores  PHQ - 2 Score 0 0 0 0 0  PHQ- 9 Score 0        Fall Risk     01/25/2024    3:52 PM 12/22/2023    1:50 PM 08/09/2023    1:44 PM 04/20/2023   12:56 PM 02/06/2023   10:22 AM  Fall Risk   Falls in the past year? 0 0 0 0 0  Number falls in past yr: 0 0 0 0 0  Injury with Fall? 0 0 0 0 0  Risk for fall due to : No Fall Risks  No Fall Risks  No Fall Risks  Follow up Falls prevention discussed;Falls evaluation completed Falls evaluation completed  Falls evaluation completed     MEDICARE RISK AT HOME:  Medicare Risk at Home Any stairs in or around the home?: Yes If so, are there any without handrails?: No Home free of loose throw rugs in walkways, pet beds, electrical cords, etc?: Yes Adequate lighting in your home to reduce risk of falls?: Yes Life alert?: No Use of a cane, walker or w/c?: No Grab bars in the bathroom?: No Shower chair or bench in shower?: Yes Elevated toilet seat or a handicapped toilet?: Yes  TIMED UP AND GO:  Was the test performed?  No  Cognitive Function: 6CIT completed        01/25/2024    3:55 PM 02/02/2023   11:35 AM  6CIT Screen  What Year? 0 points 0 points  What month? 0 points 0 points  What time? 0 points 0 points  Count back from 20 0 points 0 points  Months in reverse 0 points 0 points  Repeat phrase 0 points 2 points  Total Score 0 points 2 points    Immunizations Immunization History  Administered Date(s) Administered   Influenza-Unspecified 07/09/2001, 08/03/2004, 06/19/2006, 07/06/2007, 05/27/2008   PFIZER(Purple Top)SARS-COV-2 Vaccination 11/21/2019, 12/13/2019   Pneumococcal-Unspecified  12/21/2010    Screening Tests Health Maintenance  Topic Date Due   DTaP/Tdap/Td (1 - Tdap) Never done   Pneumonia Vaccine 74+ Years old (1 of 1 - PCV) 12/03/1994   Zoster Vaccines- Shingrix (1 of 2) Never done   COVID-19 Vaccine (3 - 2024-25 season) 05/21/2023   INFLUENZA VACCINE  04/19/2024   Medicare  Annual Wellness (AWV)  01/24/2025   Hepatitis C Screening  Completed   HPV VACCINES  Aged Out   Meningococcal B Vaccine  Aged Out    Health Maintenance  Health Maintenance Due  Topic Date Due   DTaP/Tdap/Td (1 - Tdap) Never done   Pneumonia Vaccine 78+ Years old (1 of 1 - PCV) 12/03/1994   Zoster Vaccines- Shingrix (1 of 2) Never done   COVID-19 Vaccine (3 - 2024-25 season) 05/21/2023   Health Maintenance Items Addressed:patient declined vaccinations  Additional Screening:  Vision Screening: Recommended annual ophthalmology exams for early detection of glaucoma and other disorders of the eye.  Dental Screening: Recommended annual dental exams for proper oral hygiene  Community Resource Referral / Chronic Care Management: CRR required this visit?  No   CCM required this visit?  No   Plan:    I have personally reviewed and noted the following in the patient's chart:   Medical and social history Use of alcohol, tobacco or illicit drugs  Current medications and supplements including opioid prescriptions. Patient is not currently taking opioid prescriptions. Functional ability and status Nutritional status Physical activity Advanced directives List of other physicians Hospitalizations, surgeries, and ER visits in previous 12 months Vitals Screenings to include cognitive, depression, and falls Referrals and appointments  In addition, I have reviewed and discussed with patient certain preventive protocols, quality metrics, and best practice recommendations. A written personalized care plan for preventive services as well as general preventive health recommendations were provided to patient.   Freeda Jerry, [place]   01/25/2024   After Visit Summary: (MyChart) Due to this being a telephonic visit, the after visit summary with patients personalized plan was offered to patient via MyChart   Notes: Nothing significant to report at this time.

## 2024-01-25 NOTE — Patient Instructions (Signed)
 [name] , Thank you for taking time out of your busy schedule to complete your Annual Wellness Visit with me. I enjoyed our conversation and look forward to speaking with you again next year. I, as well as your care team,  appreciate your ongoing commitment to your health goals. Please review the following plan we discussed and let me know if I can assist you in the future. Your Game plan/ To Do List    Referrals: If you haven't heard from the office you've been referred to, please reach out to them at the phone provided.   Follow up Visits: Next Medicare AWV with our clinical staff: 02/06/2025   Have you seen your provider in the last 6 months (3 months if uncontrolled diabetes)? Yes Next Office Visit with your provider: 01/26/2024  Clinician Recommendations:  Aim for 30 minutes of exercise or brisk walking, 6-8 glasses of water, and 5 servings of fruits and vegetables each day.       This is a list of the screening recommended for you and due dates:  Health Maintenance  Topic Date Due   DTaP/Tdap/Td vaccine (1 - Tdap) Never done   Pneumonia Vaccine (1 of 1 - PCV) 12/03/1994   Zoster (Shingles) Vaccine (1 of 2) Never done   COVID-19 Vaccine (3 - 2024-25 season) 05/21/2023   Flu Shot  04/19/2024   Medicare Annual Wellness Visit  01/24/2025   Hepatitis C Screening  Completed   HPV Vaccine  Aged Out   Meningitis B Vaccine  Aged Out    Advanced directives: (Declined) Advance directive discussed with you today. Even though you declined this today, please call our office should you change your mind, and we can give you the proper paperwork for you to fill out. Advance Care Planning is important because it:  [x]  Makes sure you receive the medical care that is consistent with your values, goals, and preferences  [x]  It provides guidance to your family and loved ones and reduces their decisional burden about whether or not they are making the right decisions based on your wishes.  Follow  the link provided in your after visit summary or read over the paperwork we have mailed to you to help you started getting your Advance Directives in place. If you need assistance in completing these, please reach out to us  so that we can help you!  See attachments for Preventive Care and Fall Prevention Tips.

## 2024-01-26 ENCOUNTER — Telehealth (INDEPENDENT_AMBULATORY_CARE_PROVIDER_SITE_OTHER): Admitting: Family

## 2024-01-26 DIAGNOSIS — R7303 Prediabetes: Secondary | ICD-10-CM | POA: Diagnosis not present

## 2024-01-26 DIAGNOSIS — I10 Essential (primary) hypertension: Secondary | ICD-10-CM

## 2024-01-26 DIAGNOSIS — E785 Hyperlipidemia, unspecified: Secondary | ICD-10-CM

## 2024-01-26 MED ORDER — LISINOPRIL-HYDROCHLOROTHIAZIDE 20-25 MG PO TABS: 1.0000 | ORAL_TABLET | Freq: Every day | ORAL | 0 refills | Status: AC

## 2024-01-26 MED ORDER — AMLODIPINE BESYLATE 5 MG PO TABS: 5.0000 mg | ORAL_TABLET | Freq: Every day | ORAL | 0 refills | Status: AC

## 2024-01-26 MED ORDER — ATORVASTATIN CALCIUM 80 MG PO TABS: 80.0000 mg | ORAL_TABLET | Freq: Every day | ORAL | 0 refills | Status: AC

## 2024-01-26 NOTE — Progress Notes (Signed)
 Virtual Visit via Video Note  I connected with [name], on 01/26/2024 at 3:36 PM by video and verified that I am speaking with the correct person using two identifiers.  Consent: I discussed the limitations, risks, security and privacy concerns of performing an evaluation and management service by video and the availability of in person appointments. I also discussed with the patient that there may be a patient responsible charge related to this service. The patient expressed understanding and agreed to proceed.   Location of Patient: Home  Location of Provider: [facility]   Persons participating in Telemedicine visit: Rolland Cline Lavona Pounds, NP Alona Jamaica, CMA   History of Present Illness: Aaron Gregory is a 78 y.o. male who presents for chronic conditions follow-up.   Issues/concerns for discussion: - Doing well on Amlodipine  and Lisinopril -Hydrochlorothiazide , no issues/concerns. He does not complain of red flag symptoms such as but not limited to chest pain, shortness of breath, worst headache of life, nausea/vomiting.  - Doing well on Atorvastatin , no issues/concerns. - He plans to schedule lab visit at later date.   Past Medical History:  Diagnosis Date   Arthritis    lower back bulging discs x 2   Closed right ankle fracture 04/15/2020   fell down has cast on   Headache    Hepatitis    heaptitis a 1970's   Hyperlipemia    Hypertension    No Known Allergies  Current Outpatient Medications on File Prior to Visit  Medication Sig Dispense Refill   amLODipine  (NORVASC ) 5 MG tablet Take 1 tablet (5 mg total) by mouth daily. 90 tablet 0   atorvastatin  (LIPITOR) 80 MG tablet Take 1 tablet (80 mg total) by mouth daily. 90 tablet 0   lisinopril -hydrochlorothiazide  (ZESTORETIC ) 20-25 MG tablet Take 1 tablet by mouth daily. 90 tablet 0   VITAMIN D, CHOLECALCIFEROL, PO Take by mouth. DAILY     No current facility-administered  medications on file prior to visit.    Observations/Objective: Alert and oriented x 3. Not in acute distress. Physical examination not completed as this is a telemedicine visit.  Assessment and Plan: 1. Primary hypertension (Primary) - Continue Amlodipine  and Lisinopril -Hydrochlorothiazide  as prescribed.  - Counseled on blood pressure goal of less than 130/80, low-sodium, DASH diet, medication compliance, and 150 minutes of moderate intensity exercise per week as tolerated. Counseled on medication adherence and adverse effects. - Routine screening.  - Follow-up with primary provider in 3 months or sooner if needed. - amLODipine  (NORVASC ) 5 MG tablet; Take 1 tablet (5 mg total) by mouth daily.  Dispense: 90 tablet; Refill: 0 - lisinopril -hydrochlorothiazide  (ZESTORETIC ) 20-25 MG tablet; Take 1 tablet by mouth daily.  Dispense: 90 tablet; Refill: 0 - Basic Metabolic Panel; Future  2. Hyperlipidemia, unspecified hyperlipidemia type - Continue Atorvastatin  as prescribed. Counseled on medication adherence/adverse effects. - Routine screening.  - Follow-up with primary provider in 3 months or sooner if needed. - atorvastatin  (LIPITOR) 80 MG tablet; Take 1 tablet (80 mg total) by mouth daily.  Dispense: 90 tablet; Refill: 0 - Lipid panel; Future  3. Prediabetes - Routine screening.  - POCT glycosylated hemoglobin (Hb A1C); Future   Follow Up Instructions: Follow-up with primary provider in 3 months or sooner if needed.   Patient was given clear instructions to go to Emergency Department or return to medical center if symptoms don't improve, worsen, or new problems develop.The patient verbalized understanding.  I discussed the assessment and treatment plan with  the patient. The patient was provided an opportunity to ask questions and all were answered. The patient agreed with the plan and demonstrated an understanding of the instructions.   The patient was advised to call back or seek an  in-person evaluation if the symptoms worsen or if the condition fails to improve as anticipated.     I provided 5 minutes total of non-face-to-face time during this encounter.   Senaida Dama, NP  [facility] [place], [place] [phone] 01/26/2024, 3:36 PM

## 2024-06-17 NOTE — Progress Notes (Deleted)
 NEUROLOGY FOLLOW UP OFFICE NOTE  Aaron Gregory [id number]  Subjective:  Aaron Gregory is a 78 y.o. year old male with a history of HTN, HLD, pre-diabetes, OA, erectile dysfunction, and BPH  who we last saw on 12/22/23 for neuropathy.  To briefly review: 04/20/23: Patient is having tingling in his feet and ankles. He feels like it started in the right foot about 2 years ago after he broke his right ankle. He also has occasional paresthesias in the back of his arms and finger tips. It is not affecting function, per patient. He denies weakness, imbalance, or falls. He endorses some pain in his back between his shoulder blades, but otherwise does not have a lot of low back pain. He endorses some low back stiffness. Patient is concerned for neuropathy.   Patient gets occasional muscle cramps in his calves, especially over the last few months.   The patient does not report symptoms referable to autonomic dysfunction including impaired sweating, excessive mucosal dryness, gastroparetic early satiety, postprandial abdominal bloating, constipation, bowel or bladder dyscontrol. He endorses occasional lightheadedness when standing, but no syncope and feels cold more quickly than he used to. Patient has been treated for erectile dysfunction for about the last 8 years. He is currently getting a shot (Trimax) that seems to be helping.   He does not report any constitutional symptoms like fever, night sweats, anorexia or unintentional weight loss.   EtOH use: 1 drink per week; many years ago would drink more on the weekends  Restrictive diet? No Family history of neuropathy/myopathy/neurologic disease? No  12/22/23: Patient returns because he is having more tingling and numbness in the left foot. He denies imbalance or falls. He only occasionally has symptoms in his hands.   He may have tried lidocaine  cream but not recently.  Most recent Assessment and Plan (12/22/23): This is Aaron Gregory, a 78 y.o. male with  numbness and tingling in bilateral feet. 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 His known risk factors include pre-diabetes and exposure to agent orange.   Plan: -Lidocaine  cream PRN -Alpha lipoic acid 600 mg once or twice daily -Patient does not want medicine yet, but would try gabapentin next if he changes his mind  Since their last visit: ***  MEDICATIONS:  Outpatient Encounter Medications as of 06/26/2024  Medication Sig   amLODipine  (NORVASC ) 5 MG tablet Take 1 tablet (5 mg total) by mouth daily.   atorvastatin  (LIPITOR) 80 MG tablet Take 1 tablet (80 mg total) by mouth daily.   lisinopril -hydrochlorothiazide  (ZESTORETIC ) 20-25 MG tablet Take 1 tablet by mouth daily.   VITAMIN D, CHOLECALCIFEROL, PO Take by mouth. DAILY   No facility-administered encounter medications on file as of 06/26/2024.    PAST MEDICAL HISTORY: Past Medical History:  Diagnosis Date   Arthritis    lower back bulging discs x 2   Closed right ankle fracture 04/15/2020   fell down has cast on   Headache    Hepatitis    heaptitis a 1970's   Hyperlipemia    Hypertension     PAST SURGICAL HISTORY: Past Surgical History:  Procedure Laterality Date   MULTIPLE TOOTH EXTRACTIONS  2017   ORIF ANKLE FRACTURE Right 04/28/2020   Procedure: OPEN REDUCTION INTERNAL FIXATION (ORIF) ANKLE FRACTURE;  Surgeon: Beverley Evalene BIRCH, MD;  Location: [facility];  Service: Orthopedics;  Laterality: Right;   PERIRECTAL ABSCESS SURGERY  1970'S    ALLERGIES: No Known Allergies  FAMILY HISTORY: Family  History  Problem Relation Age of Onset   Alcohol abuse Father     SOCIAL HISTORY: Social History   Tobacco Use   Smoking status: Former    Current packs/day: 0.00    Average packs/day: 0.3 packs/day for 2.0 years (0.5 ttl pk-yrs)    Types: Cigarettes    Start date: 06/19/1966    Quit date: 06/19/1968    Years since quitting: 56.0   Smokeless tobacco: Never  Vaping Use    Vaping status: Never Used  Substance Use Topics   Alcohol use: Yes    Comment: occ 2 drinks per week   Drug use: Never   Social History   Social History Narrative   Are you right handed or left handed? Right   Are you currently employed ?    What is your current occupation? retired   Do you live at home alone?no   Who lives with you? wife   What type of home do you live in: 1 story or 2 story? two   Caffeine 1 cup a day       Objective:  Vital Signs:  There were no vitals taken for this visit.  ***  Labs and Imaging review: No new results***  Previously reviewed results: 04/20/23: B1 wnl IFE no M protein B12: 373   HbA1c (08/09/23): 6.1   BMP (03/16/23): significant for glucose of 125, Cr 1.26 HbA1c (02/06/23): 6.1 Lipid panel (02/06/23): Component     Latest Ref Rng 02/06/2023  Cholesterol, Total     100 - 199 mg/dL 774 (H)   Triglycerides     0 - 149 mg/dL 68   HDL Cholesterol     >39 mg/dL 54   VLDL Cholesterol Cal     5 - 40 mg/dL 12   LDL Chol Calc (NIH)     0 - 99 mg/dL 840 (H)   Total CHOL/HDL Ratio     0.0 - 5.0 ratio 4.2     TSH (01/18/22): 1.130   CT head wo contrast (04/15/20): FINDINGS: Brain: No evidence of acute infarction, hemorrhage, hydrocephalus, extra-axial collection or mass lesion/mass effect.   Vascular: Negative for hyperdense vessel   Skull: Negative   Sinuses/Orbits: Cyst in the right maxillary sinus otherwise clear sinuses. Negative orbit   Other: None   IMPRESSION: Negative CT head  Assessment/Plan:  This is Aaron Gregory, a 78 y.o. male with: ***   Plan: ***  Return to clinic in ***  Total time spent reviewing records, interview, history/exam, documentation, and coordination of care on day of encounter:  *** min  Venetia Potters, MD

## 2024-06-26 ENCOUNTER — Encounter: Payer: Self-pay | Admitting: Neurology

## 2024-06-26 ENCOUNTER — Ambulatory Visit: Admitting: Neurology

## 2024-06-26 DIAGNOSIS — Z029 Encounter for administrative examinations, unspecified: Secondary | ICD-10-CM

## 2024-09-13 ENCOUNTER — Ambulatory Visit: Admission: EM | Admit: 2024-09-13 | Discharge: 2024-09-13 | Disposition: A | Discharge status: Home / Self Care

## 2024-09-13 ENCOUNTER — Encounter: Payer: Self-pay | Admitting: Emergency Medicine

## 2024-09-13 DIAGNOSIS — G629 Polyneuropathy, unspecified: Secondary | ICD-10-CM | POA: Insufficient documentation

## 2024-09-13 DIAGNOSIS — J111 Influenza due to unidentified influenza virus with other respiratory manifestations: Secondary | ICD-10-CM | POA: Diagnosis not present

## 2024-09-13 DIAGNOSIS — E785 Hyperlipidemia, unspecified: Secondary | ICD-10-CM | POA: Insufficient documentation

## 2024-09-13 DIAGNOSIS — J069 Acute upper respiratory infection, unspecified: Secondary | ICD-10-CM

## 2024-09-13 DIAGNOSIS — Z7729 Contact with and (suspected ) exposure to other hazardous substances: Secondary | ICD-10-CM | POA: Insufficient documentation

## 2024-09-13 LAB — POCT INFLUENZA A/B
Influenza A, POC: POSITIVE — AB
Influenza B, POC: POSITIVE — AB

## 2024-09-13 MED ORDER — OSELTAMIVIR PHOSPHATE 75 MG PO CAPS: 75.0000 mg | ORAL_CAPSULE | Freq: Two times a day (BID) | ORAL | 0 refills | Status: AC

## 2024-09-13 NOTE — Discharge Instructions (Addendum)

## 2024-09-13 NOTE — ED Provider Notes (Signed)
 " [facility]    CSN: [id number] Arrival date & time: 09/13/24  1337      History   Chief Complaint Chief Complaint  Patient presents with   Sore Throat   Nasal Congestion   Generalized Body Aches   Cough    HPI Aaron Gregory is a 78 y.o. male.   Pt presents today due to 3 days of nasal congestion, postnasal drip, throat pain, body aches, reduced appetite.  Patient states that he has also had a fever at home, highest temp no more than 100.  Takes states that he has been taking Goody powders for symptoms with relief.  Patient states that he feels like his grandson is starting it when he has.  The history is provided by the patient.  Sore Throat  Cough   Past Medical History:  Diagnosis Date   Arthritis    lower back bulging discs x 2   Closed right ankle fracture 04/15/2020   fell down has cast on   Headache    Hepatitis    heaptitis a 1970's   Hyperlipemia    Hypertension     Patient Active Problem List   Diagnosis Date Noted   Exposure to potentially hazardous substance 09/13/2024   Peripheral polyneuropathy 09/13/2024   Hyperlipidemia LDL goal <100 09/13/2024   Acute upper respiratory infection 11/12/2021   Chronic sinusitis 11/12/2021   Erectile dysfunction 11/12/2021   Hypertensive disorder 11/12/2021   Other and unspecified hyperlipidemia 11/12/2021   Benign prostatic hyperplasia without urinary obstruction 11/12/2021   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   Pre-diabetes 11/12/2021   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 Authorizing Provider  amLODipine  (NORVASC ) 5 MG tablet Take 1 tablet (5 mg total) by mouth daily. 01/26/24 09/13/24 Yes Massey, Amy J, NP  atorvastatin  (LIPITOR) 80 MG tablet Take 1 tablet (80 mg total) by mouth daily. 01/26/24 09/13/24 Yes Massey, Amy J, NP  lisinopril -hydrochlorothiazide  (ZESTORETIC ) 20-25 MG tablet Take 1 tablet by mouth daily. 01/26/24 09/13/24 Yes Massey, Amy J, NP  VITAMIN D, CHOLECALCIFEROL, PO Take by mouth. DAILY   Yes [provider]  sildenafil (VIAGRA) 100 MG tablet 1 tablet as needed Orally Once a day    [provider]    Family History Family History  Problem Relation Age of Onset   Alcohol abuse Father     Social History Social History[1]   Allergies   Patient has no known allergies.   Review of Systems Review of Systems  Respiratory:  Positive for cough.      Physical Exam Triage Vital Signs ED Triage Vitals  Encounter Vitals Group     BP 09/13/24 1511 (!) 178/106     Girls Systolic BP Percentile --      Girls Diastolic BP Percentile --      Boys Systolic BP Percentile --      Boys Diastolic BP Percentile --  Pulse Rate 09/13/24 1511 87     Resp 09/13/24 1511 18     Temp 09/13/24 1511 98.2 F (36.8 C)     Temp Source 09/13/24 1511 Oral     SpO2 09/13/24 1511 95 %     Weight --      Height --      Head Circumference --      Peak Flow --      Pain Score 09/13/24 1510 2     Pain Loc --      Pain Education --      Exclude from Growth Chart --    No data found.  Updated Vital Signs BP (!) 178/106 (BP Location: Left Arm) Comment: has not taken bp meds today  Pulse 87   Temp 98.2 F (36.8 C) (Oral)   Resp 18   SpO2 95%   Visual Acuity Right Eye Distance:   Left Eye Distance:   Bilateral Distance:    Right Eye Near:   Left Eye  Near:    Bilateral Near:     Physical Exam Vitals and nursing note reviewed.  Constitutional:      General: He is not in acute distress.    Appearance: Normal appearance. He is not ill-appearing, toxic-appearing or diaphoretic.  HENT:     Nose: Congestion (moderately enlarged turbinates) present. No rhinorrhea.     Mouth/Throat:     Mouth: Mucous membranes are moist.     Pharynx: Oropharynx is clear. No oropharyngeal exudate or posterior oropharyngeal erythema.  Eyes:     General: No scleral icterus. Cardiovascular:     Rate and Rhythm: Normal rate and regular rhythm.     Heart sounds: Normal heart sounds.  Pulmonary:     Effort: Pulmonary effort is normal. No respiratory distress.     Breath sounds: Normal breath sounds. No wheezing or rhonchi.  Skin:    General: Skin is warm.  Neurological:     Mental Status: He is alert and oriented to person, place, and time.  Psychiatric:        Mood and Affect: Mood normal.        Behavior: Behavior normal.      UC Treatments / Results  Labs (all labs ordered are listed, but only abnormal results are displayed) Labs Reviewed  POCT INFLUENZA A/B    EKG   Radiology No results found.  Procedures Procedures (including critical care time)  Medications Ordered in UC Medications - No data to display  Initial Impression / Assessment and Plan / UC Course  I have reviewed the triage vital signs and the nursing notes.  Pertinent labs & imaging results that were available during my care of the patient were reviewed by me and considered in my medical decision making (see chart for details).      Final Clinical Impressions(s) / UC Diagnoses   Final diagnoses:  Viral URI   Discharge Instructions   None    ED Prescriptions   None    PDMP not reviewed this encounter.    [1]  Social History Tobacco Use   Smoking status: Former    Current packs/day: 0.00    Average packs/day: 0.3 packs/day for 2.0 years (0.5 ttl  pk-yrs)    Types: Cigarettes    Start date: 06/19/1966    Quit date: 06/19/1968    Years since quitting: 56.2   Smokeless tobacco: Never  Vaping Use   Vaping status: Never Used  Substance Use Topics  Alcohol use: Yes    Comment: occ 2 drinks per week   Drug use: Never     Andra Corean BROCKS, PA-C 09/13/24 1644  "

## 2024-09-13 NOTE — ED Triage Notes (Signed)
 Pt reports nasal congestion, sore throat, productive cough, and body aches x3 days. Denies fevers or chills. Sick family contacts. Taking goody powders with some relief.

## 2024-09-18 NOTE — Progress Notes (Signed)
 Aaron Gregory                                          MRN: [id number]   09/18/2024   The [facility] Quality Team Specialist reviewed this patient medical record for the purposes of chart review for care gap closure. The following were reviewed: chart review for care gap closure-controlling blood pressure.    [facility]I Quality Team

## 2024-10-16 NOTE — Progress Notes (Signed)
 Aaron Gregory                                          MRN: [id number]   10/16/2024   The [facility] Quality Team Specialist reviewed this patient medical record for the purposes of chart review for care gap closure. The following were reviewed: chart review for care gap closure-controlling blood pressure.    [facility]BCI Quality Team

## 2025-02-06 ENCOUNTER — Ambulatory Visit
